# Patient Record
Sex: Male | Born: 1990 | Race: Black or African American | Hispanic: No | Marital: Single | Smoking: Never smoker
Health system: Southern US, Community
[De-identification: ages and names within clinical notes are randomized; demographics above are authoritative.]

## PROBLEM LIST (undated history)

## (undated) DIAGNOSIS — J301 Allergic rhinitis due to pollen: Secondary | ICD-10-CM

## (undated) DIAGNOSIS — J45998 Other asthma: Secondary | ICD-10-CM

## (undated) HISTORY — DX: Allergic rhinitis due to pollen: J30.1

## (undated) HISTORY — PX: TOOTH EXTRACTION: SUR596

## (undated) HISTORY — DX: Other asthma: J45.998

---
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
## 2011-01-11 ENCOUNTER — Emergency Department (HOSPITAL_COMMUNITY): Payer: 59

## 2011-01-11 ENCOUNTER — Emergency Department (HOSPITAL_COMMUNITY)
Admission: EM | Admit: 2011-01-11 | Discharge: 2011-01-11 | Disposition: A | Discharge status: Home / Self Care | Payer: 59 | Attending: Emergency Medicine | Admitting: Emergency Medicine

## 2011-01-11 DIAGNOSIS — R059 Cough, unspecified: Secondary | ICD-10-CM | POA: Insufficient documentation

## 2011-01-11 DIAGNOSIS — J3489 Other specified disorders of nose and nasal sinuses: Secondary | ICD-10-CM | POA: Insufficient documentation

## 2011-01-11 DIAGNOSIS — R509 Fever, unspecified: Secondary | ICD-10-CM | POA: Insufficient documentation

## 2011-01-11 DIAGNOSIS — J45909 Unspecified asthma, uncomplicated: Secondary | ICD-10-CM | POA: Insufficient documentation

## 2011-01-11 DIAGNOSIS — R05 Cough: Secondary | ICD-10-CM | POA: Insufficient documentation

## 2011-01-11 DIAGNOSIS — J40 Bronchitis, not specified as acute or chronic: Secondary | ICD-10-CM | POA: Insufficient documentation

## 2011-01-11 DIAGNOSIS — R0989 Other specified symptoms and signs involving the circulatory and respiratory systems: Secondary | ICD-10-CM | POA: Insufficient documentation

## 2011-05-14 ENCOUNTER — Emergency Department (HOSPITAL_COMMUNITY)
Admission: EM | Admit: 2011-05-14 | Discharge: 2011-05-15 | Disposition: A | Discharge status: Home / Self Care | Payer: 59 | Attending: Emergency Medicine | Admitting: Emergency Medicine

## 2011-05-14 DIAGNOSIS — R071 Chest pain on breathing: Secondary | ICD-10-CM | POA: Insufficient documentation

## 2011-05-14 DIAGNOSIS — J45909 Unspecified asthma, uncomplicated: Secondary | ICD-10-CM | POA: Insufficient documentation

## 2011-05-15 ENCOUNTER — Emergency Department (HOSPITAL_COMMUNITY): Payer: 59

## 2013-10-09 ENCOUNTER — Encounter: Payer: Self-pay | Admitting: Family Medicine

## 2013-10-09 ENCOUNTER — Ambulatory Visit (INDEPENDENT_AMBULATORY_CARE_PROVIDER_SITE_OTHER): Payer: BC Managed Care – PPO | Admitting: Family Medicine

## 2013-10-09 VITALS — BP 118/72 | HR 72 | Temp 97.9°F | Ht 70.5 in | Wt 182.8 lb

## 2013-10-09 DIAGNOSIS — J45998 Other asthma: Secondary | ICD-10-CM

## 2013-10-09 DIAGNOSIS — J45909 Unspecified asthma, uncomplicated: Secondary | ICD-10-CM

## 2013-10-09 DIAGNOSIS — J301 Allergic rhinitis due to pollen: Secondary | ICD-10-CM

## 2013-10-09 NOTE — Progress Notes (Signed)
Date:  10/09/2013   Name:  Corey Hall   DOB:  [birth year]   MRN:  [id number] Gender: male Age: 22 y.o.  Primary Physician:  Hannah Beat, MD   Chief Complaint: Establish Care   Subjective:   History of Present Illness:  Corey Hall is a 22 y.o. pleasant patient who presents with the following:  Healthy young man who recently transferred from [place] in [place] to [place] and see. He went to [employer] high school as well as [employer].  From a health standpoint, he is basically pretty healthy. He does have some ALLERGIC rhinitis, and he takes some ALLERGY shots as well as using some ALLERGY medication.  He did have asthma when he was a child, but he has outgrown this and no longer needs any medication.  He is otherwise healthy and exercises regularly.  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  3 b's and a c.   Patient Active Problem List   Diagnosis Date Noted  . Asthma in remission   . Allergic rhinitis due to pollen     Past Medical History  Diagnosis Date  . Allergic rhinitis due to pollen   . Asthma in remission     Past Surgical History  Procedure Laterality Date  . Tooth extraction      History   Social History  . Marital Status: Single    Spouse Name: N/A    Number of Children: N/A  . Years of Education: N/A   Occupational History  . Student     [employer]   Social History Main Topics  . Smoking status: Never Smoker   . Smokeless tobacco: Never Used  . Alcohol Use: Yes     Comment: intermittent  . Drug Use: Yes     Comment: MJ, intermittent  . Sexual Activity: Yes    Partners: Female    Birth Control/ Protection: Condom   Other Topics Concern  . Not on file   Social History Narrative   Soph at [employer]   Exercise and Sports Science Major   Enjoys working out and [employer]    Family History  Problem Relation Age of Onset  . Hypertension Father   .  Hypertension Maternal Grandmother   . Diabetes Maternal Grandmother   . Heart disease Maternal Grandmother   . Stroke Maternal Grandmother   . Hypertension Paternal Grandmother   . Heart disease Paternal Grandmother   . Cancer Paternal Grandfather     Allergies no known allergies  Medication list has been reviewed and updated.  Review of Systems:   GEN: No acute illnesses, no fevers, chills. GI: No n/v/d, eating normally Pulm: No SOB Interactive and getting along well at home.  Otherwise, ROS is as per the HPI.  Objective:   Physical Examination: BP 118/72  Pulse 72  Temp(Src) 97.9 F (36.6 C) (Oral)  Ht 5' 10.5" (1.791 m)  Wt 182 lb 12 oz (82.895 kg)  BMI 25.84 kg/m2  SpO2 98%  Ideal Body Weight: Weight in (lb) to have BMI = 25: 176.4   GEN: WDWN, NAD, Non-toxic, A & O x 3 HEENT: Atraumatic, Normocephalic. Neck supple. No masses, No LAD. Ears and Nose: No external deformity. CV: RRR, No M/G/R. No JVD. No thrill. No extra heart sounds. PULM: CTA B, no wheezes, crackles, rhonchi. No retractions. No resp. distress. No accessory muscle use. EXTR: No  c/c/e NEURO Normal gait.  PSYCH: Normally interactive. Conversant. Not depressed or anxious appearing.  Calm demeanor.   Assessment & Plan:    Allergic rhinitis due to pollen  Asthma in remission  He is doing perfectly well, f/u prn if injured or sick.  New medications, updates to list, dose adjustments: Meds ordered this encounter  Medications  . fluticasone (VERAMYST) 27.5 MCG/SPRAY nasal spray    Sig: Place 2 sprays into the nose daily.  . fexofenadine (ALLEGRA) 180 MG tablet    Sig: Take 180 mg by mouth daily.    Signed,  Elpidio Galea. Kinston Magnan, MD, CAQ Sports Medicine  [facility] [address] [place] [address] Phone: [phone] Fax: [phone]    Medication List       This list is accurate as of: 10/09/13 11:59 PM.  Always use your most recent med list.                 fexofenadine 180 MG tablet  Commonly known as:  ALLEGRA  Take 180 mg by mouth daily.     fluticasone 27.5 MCG/SPRAY nasal spray  Commonly known as:  VERAMYST  Place 2 sprays into the nose daily.

## 2013-10-10 ENCOUNTER — Encounter: Payer: Self-pay | Admitting: Family Medicine

## 2013-10-10 DIAGNOSIS — J301 Allergic rhinitis due to pollen: Secondary | ICD-10-CM | POA: Insufficient documentation

## 2013-10-10 DIAGNOSIS — J45998 Other asthma: Secondary | ICD-10-CM | POA: Insufficient documentation

## 2014-03-25 ENCOUNTER — Ambulatory Visit (INDEPENDENT_AMBULATORY_CARE_PROVIDER_SITE_OTHER): Payer: BC Managed Care – PPO | Admitting: Family Medicine

## 2014-03-25 ENCOUNTER — Encounter: Payer: Self-pay | Admitting: Family Medicine

## 2014-03-25 VITALS — BP 118/80 | HR 65 | Temp 97.4°F | Ht 70.5 in | Wt 192.5 lb

## 2014-03-25 DIAGNOSIS — S058X9A Other injuries of unspecified eye and orbit, initial encounter: Secondary | ICD-10-CM

## 2014-03-25 DIAGNOSIS — S0502XA Injury of conjunctiva and corneal abrasion without foreign body, left eye, initial encounter: Secondary | ICD-10-CM

## 2014-03-25 NOTE — Progress Notes (Signed)
   [address] [place] [address] Phone: [phone] Fax: [phone]  Patient ID: Corey Hall MRN: [id number], DOB: August 01, 1991, 23 y.o. Date of Encounter: 03/25/2014  Primary Physician:  Hannah Beat, MD   Chief Complaint: Conjunctivitis   Subjective:   History of Present Illness:  Corey Hall is a 23 y.o. very pleasant male patient who presents with the following:  Scratch L eye? Vs pink eye.   Yesterday, when the patient and his gloves off from work, he was scratching his eye calm he thought he heard a little bit and then. Today, he woke up and his eye was goupy and red on the left side. No other known trauma or accident  Past Medical History, Surgical History, Social History, Family History, Problem List, Medications, and Allergies have been reviewed and updated if relevant.  Review of Systems: ROS: GEN: Acute illness details above GI: Tolerating PO intake GU: maintaining adequate hydration and urination Pulm: No SOB Interactive and getting along well at home.  Otherwise, ROS is as per the HPI.   Objective:   Physical Examination: BP 118/80  Pulse 65  Temp(Src) 97.4 F (36.3 C) (Oral)  Ht 5' 10.5" (1.791 m)  Wt 192 lb 8 oz (87.317 kg)  BMI 27.22 kg/m2   GEN: WDWN, NAD, Non-toxic, Alert & Oriented x 3 HEENT: Atraumatic, Normocephalic.  PERRLA, EOMI. Red / pink conjunctiva Ears and Nose: No external deformity. EXTR: No clubbing/cyanosis/edema NEURO: Normal gait.  PSYCH: Normally interactive. Conversant. Not depressed or anxious appearing.  Calm demeanor.     Laboratory and Imaging Data:  Assessment & Plan:   Corneal abrasion, left  Using fluoroscein and a Woods lamp, the patient had a small corneal abrasion on the lateral aspect of his left eye.  This was reviewed with him. I placed him on erythromycin ophthalmological ointment, 1 cm ribbon 6 times daily for 1 week. He knows to seek medical attention if his eye gets worse over  the next few days. He is to call me if he is not doing quite a bit better by Monday.  Signed,  Elpidio Galea. Katalyn Matin, MD, CAQ Sports Medicine  Current Medications at Discharge:   Medication List       This list is accurate as of: 03/25/14  4:12 PM.  Always use your most recent med list.               fexofenadine 180 MG tablet  Commonly known as:  ALLEGRA  Take 180 mg by mouth daily.     fluticasone 27.5 MCG/SPRAY nasal spray  Commonly known as:  VERAMYST  Place 2 sprays into the nose daily.

## 2014-03-25 NOTE — Progress Notes (Signed)
Pre visit review using our clinic review tool, if applicable. No additional management support is needed unless otherwise documented below in the visit note. 

## 2014-03-29 ENCOUNTER — Encounter: Payer: Self-pay | Admitting: Family Medicine

## 2014-03-29 ENCOUNTER — Telehealth: Payer: Self-pay | Admitting: Family Medicine

## 2014-03-29 ENCOUNTER — Ambulatory Visit (INDEPENDENT_AMBULATORY_CARE_PROVIDER_SITE_OTHER): Payer: BC Managed Care – PPO | Admitting: Family Medicine

## 2014-03-29 VITALS — BP 116/78 | HR 76 | Temp 98.5°F | Ht 70.5 in | Wt 192.0 lb

## 2014-03-29 DIAGNOSIS — J029 Acute pharyngitis, unspecified: Secondary | ICD-10-CM

## 2014-03-29 LAB — POCT RAPID STREP A (OFFICE): Rapid Strep A Screen: NEGATIVE

## 2014-03-29 NOTE — Telephone Encounter (Signed)
Have the patient come now, ASAP and i will see him  Force onto my schedule please.

## 2014-03-29 NOTE — Telephone Encounter (Signed)
Patient Information:  Caller Name: Ladean RayaConstance  Phone: [phone]  Patient: Corey Hall, Corey Hall  Gender: Male  DOB: 09/14/1991  Age: 23 Years  PCP: Hannah Beatopland, [facility](Family Practice)  Office Follow Up:  Does the office need to follow up with this patient?: Yes  Instructions For The Office: No appts left for today. Ok to be seen tomorrow? or work-in today available? or should he go to UC today? Please call pt back ASAP to advise.  RN Note:  Will make appt  Symptoms  Reason For Call & Symptoms: Sore throat x 3-4 days with fine red rash on face. Denies rash anywhere else on body. Rash is not bothersome. Also has some nasal congestion and mild cough but states sore throat is most bothersome symptom. No fever.  Reviewed Health History In EMR: Yes  Reviewed Medications In EMR: Yes  Reviewed Allergies In EMR: Yes  Reviewed Surgeries / Procedures: Yes  Date of Onset of Symptoms: 03/26/2014  Treatments Tried: throat lozenges; Tylenol  Treatments Tried Worked: Yes  Guideline(s) Used:  Sore Throat  Disposition Per Guideline:   See Today in Office  Reason For Disposition Reached:   Widespread rash (especially chest and abdomen)------rash is actually on face per patient  Advice Given:  For Relief of Sore Throat Pain:  Sip warm chicken broth or apple juice.  Gargle warm salt water 3 times daily (1 teaspoon of salt in 8 oz or 240 ml of warm water).  Pain Medicines:  For pain relief, you can take either acetaminophen, ibuprofen, or naproxen.  They are over-the-counter (OTC) pain drugs. You can buy them at the drugstore.  Ibuprofen (e.g., Motrin, Advil):  Take 400 mg (two 200 mg pills) by mouth every 6 hours.  Soft Diet:   Cold drinks and milk shakes are especially good (Reason: swollen tonsils can make some foods hard to swallow).  Liquids:  Adequate liquid intake is important to prevent dehydration. Drink 6-8 glasses of water per day.  Call Back If:  You become worse.  Patient Will  Follow Care Advice:  YES

## 2014-03-29 NOTE — Progress Notes (Signed)
[address] [place] [id number] Phone: [phone] Fax: [phone]  Patient ID: Corey LemmaMatthew E Hall MRN: [id number], DOB: 12/05/1990, 23 y.o. Date of Encounter: 03/29/2014  Primary Physician:  Hannah BeatSpencer Faydra Korman, MD   Chief Complaint: Sore Throat   Subjective:   History of Present Illness:  This 23 y.o. male patient presents with sore throat for 2 weeks. No subjective fevers, achiness, headache. Some nausea. No significant URI sx. No significant cough. Mild drainage - AR, stopped allergy meds  + 2 sexual encounters in the last month  The PMH, PSH, Social History, Family History, Medications, and allergies [facility], and have been updated if relevant.   ROS: GEN: Acute illness details above GI: Tolerating PO intake GU: maintaining adequate hydration and urination Pulm: No SOB Interactive and getting along well at home.  Otherwise, ROS is as per the HPI.  Objective:   Physical Exam  Filed Vitals:   03/29/14 1514  BP: 116/78  Pulse: 76  Temp: 98.5 F (36.9 C)  TempSrc: Oral  Height: 5' 10.5" (1.791 m)  Weight: 192 lb (87.091 kg)    Gen: WDWN, NAD; A & O x3, cooperative. Pleasant.Globally Non-toxic HEENT: Normocephalic and atraumatic. Throat: swollen tonsills without exudate R TM clear, L TM - good landmarks, No fluid present. rhinnorhea. No frontal or maxillary sinus T. MMM NECK: Anterior cervical  LAD is present - TTP CV: RRR, No M/G/R, cap refill <2 sec PULM: Breathing comfortably in no respiratory distress. no wheezing, crackles, rhonchi EXT: No c/c/e PSYCH: Friendly, good eye contact MSK: Nml gait   Results for orders placed in visit on 03/29/14  POCT RAPID STREP A (OFFICE)      Result Value Ref Range   Rapid Strep A Screen Negative  Negative    Assessment & Plan:   Sore throat - Plan: POCT rapid strep A, Culture, Group A Strep, GC/CT Probe, Amp (Throat)   Unclear cause. More likely viral, but with exposures and risk,  culture for GC and strep.  New Prescriptions   No medications on file    Orders Placed This Encounter  Procedures  . Culture, Group A Strep  . GC/CT Probe, Amp (Throat)  . POCT rapid strep A    Follow-up: No Follow-up on file. Unless noted above, the patient is to follow-up if symptoms worsen. Red flags were reviewed with the patient.   Patient Instructions: SORE THROAT -Most caused by infections, 80-85% are viral injections.  -Strep throat is bacterial and requires antibiotics -Drainage and cough can irritate throat  TREATMENT 1. Warm liquids, salt water gargles to help with the sore throat. 2. Chloraseptic as needed can help a lot. YOU CANNOT OVERDOSE ON CHLORASEPTIC. I PERSONALLY USE IT ABOUT EVERY 20 MINUTES WITH A SORE THROAT. 3. Cough drops, popsicles, or hard candy 4. Liquids - drink plenty, without caffeine 5. Salt water gargle: 1/2 tsp salt in 1/2 glass warm water 6. Avoid spicy food 7. Get plenty of sleep 8. Ice chips for comfort  Signed,  Leontyne Manville T. Krue Peterka, MD, CAQ Sports Medicine   Discontinued Medications   No medications on file   Current Medications at Discharge:   Medication List       This list is accurate as of: 03/29/14 11:59 PM.  Always use your most recent med list.               fexofenadine 180 MG tablet  Commonly known as:  ALLEGRA  Take 180 mg by mouth  daily.     fluticasone 27.5 MCG/SPRAY nasal spray  Commonly known as:  VERAMYST  Place 2 sprays into the nose daily.

## 2014-03-29 NOTE — Progress Notes (Signed)
Pre visit review using our clinic review tool, if applicable. No additional management support is needed unless otherwise documented below in the visit note. 

## 2014-03-29 NOTE — Telephone Encounter (Signed)
I spoke with patient's mother and she said she'll tell him to come now.  Patient is about 30 minutes away.  I put him on the schedule at 2:30.

## 2014-04-01 ENCOUNTER — Telehealth: Payer: Self-pay | Admitting: *Deleted

## 2014-04-01 LAB — GONOCOCCUS CULTURE: Organism ID, Bacteria: NO GROWTH

## 2014-04-01 LAB — CULTURE, GROUP A STREP

## 2014-04-01 NOTE — Telephone Encounter (Signed)
.  left message to have patient return my call.  

## 2014-04-01 NOTE — Telephone Encounter (Signed)
Message copied by Sueanne MargaritaSMITH, DESHANNON L on Thu Apr 01, 2014  2:43 PM ------      Message from: Hannah BeatOPLAND, SPENCER      Created: Thu Apr 01, 2014  8:42 AM       Please call and send in:            Strep culture was + for a less common type of strep, but we should put on antibiotics.            Gonorrhea negative so far. This one usually grows longer times in the lab. Will let you know if positive. I would think throat hurts from strep.            Amoxicillin 875 mg, 1 po bid, #20, 0 refills            Electronically Signed  By: Hannah BeatSpencer Copland, MD On: 04/01/2014 8:42 AM ------

## 2014-04-02 ENCOUNTER — Other Ambulatory Visit: Payer: Self-pay | Admitting: *Deleted

## 2014-04-02 MED ORDER — AMOXICILLIN 875 MG PO TABS: 875.0000 mg | ORAL_TABLET | Freq: Two times a day (BID) | ORAL | Status: DC

## 2014-04-02 NOTE — Telephone Encounter (Signed)
Spoke with patient and advised results   

## 2014-07-15 ENCOUNTER — Telehealth: Payer: Self-pay | Admitting: Family Medicine

## 2014-07-15 NOTE — Telephone Encounter (Signed)
Ok with me 

## 2014-07-15 NOTE — Telephone Encounter (Signed)
Patient's mother called and said patient would like to switch from Dr.Copland to Dr.Aron.  The family is seeing Dr.Aron and he would like to see the same doctor as the rest of his family. Can patient switch to Dr.Aron?

## 2014-07-15 NOTE — Telephone Encounter (Signed)
This is fine, he is very nice.

## 2014-08-12 ENCOUNTER — Ambulatory Visit: Payer: BC Managed Care – PPO

## 2015-03-10 ENCOUNTER — Ambulatory Visit (INDEPENDENT_AMBULATORY_CARE_PROVIDER_SITE_OTHER)
Admission: RE | Admit: 2015-03-10 | Discharge: 2015-03-10 | Disposition: A | Discharge status: Home / Self Care | Payer: BLUE CROSS/BLUE SHIELD | Source: Ambulatory Visit | Attending: Internal Medicine | Admitting: Internal Medicine

## 2015-03-10 ENCOUNTER — Encounter: Payer: Self-pay | Admitting: Internal Medicine

## 2015-03-10 ENCOUNTER — Ambulatory Visit (INDEPENDENT_AMBULATORY_CARE_PROVIDER_SITE_OTHER): Payer: BLUE CROSS/BLUE SHIELD | Admitting: Internal Medicine

## 2015-03-10 VITALS — BP 124/82 | HR 82 | Temp 98.5°F | Wt 204.0 lb

## 2015-03-10 DIAGNOSIS — S134XXA Sprain of ligaments of cervical spine, initial encounter: Secondary | ICD-10-CM

## 2015-03-10 NOTE — Progress Notes (Signed)
Pre visit review using our clinic review tool, if applicable. No additional management support is needed unless otherwise documented below in the visit note. 

## 2015-03-10 NOTE — Patient Instructions (Signed)
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
## 2015-03-16 ENCOUNTER — Other Ambulatory Visit: Payer: Self-pay

## 2015-03-17 ENCOUNTER — Other Ambulatory Visit: Payer: Self-pay | Admitting: Family Medicine

## 2015-03-17 ENCOUNTER — Other Ambulatory Visit (INDEPENDENT_AMBULATORY_CARE_PROVIDER_SITE_OTHER): Payer: BLUE CROSS/BLUE SHIELD

## 2015-03-17 DIAGNOSIS — Z0000 Encounter for general adult medical examination without abnormal findings: Secondary | ICD-10-CM

## 2015-03-17 LAB — COMPREHENSIVE METABOLIC PANEL
ALBUMIN: 4.3 g/dL (ref 3.5–5.2)
ALT: 53 U/L (ref 0–53)
AST: 42 U/L — ABNORMAL HIGH (ref 0–37)
Alkaline Phosphatase: 45 U/L (ref 39–117)
BILIRUBIN TOTAL: 0.6 mg/dL (ref 0.2–1.2)
BUN: 19 mg/dL (ref 6–23)
CALCIUM: 9.7 mg/dL (ref 8.4–10.5)
CHLORIDE: 103 meq/L (ref 96–112)
CO2: 26 mEq/L (ref 19–32)
Creatinine, Ser: 1.26 mg/dL (ref 0.40–1.50)
GFR: 91.56 mL/min (ref >60.00)
Glucose, Bld: 95 mg/dL (ref 70–99)
Potassium: 4.4 mEq/L (ref 3.5–5.1)
SODIUM: 137 meq/L (ref 135–145)
TOTAL PROTEIN: 7.4 g/dL (ref 6.0–8.3)

## 2015-03-17 LAB — CBC WITH DIFFERENTIAL/PLATELET
BASOS PCT: 0.6 % (ref 0.0–3.0)
Basophils Absolute: 0 10*3/uL (ref 0.0–0.1)
Eosinophils Absolute: 0.2 10*3/uL (ref 0.0–0.7)
Eosinophils Relative: 3.6 % (ref 0.0–5.0)
HEMATOCRIT: 45.9 % (ref 39.0–52.0)
HEMOGLOBIN: 15.3 g/dL (ref 13.0–17.0)
Lymphocytes Relative: 39.1 % (ref 12.0–46.0)
Lymphs Abs: 1.8 10*3/uL (ref 0.7–4.0)
MCHC: 33.4 g/dL (ref 30.0–36.0)
MCV: 83.5 fl (ref 78.0–100.0)
MONO ABS: 0.7 10*3/uL (ref 0.1–1.0)
MONOS PCT: 14.2 % — AB (ref 3.0–12.0)
NEUTROS ABS: 2 10*3/uL (ref 1.4–7.7)
Neutrophils Relative %: 42.5 % — ABNORMAL LOW (ref 43.0–77.0)
Platelets: 270 10*3/uL (ref 150.0–400.0)
RBC: 5.49 Mil/uL (ref 4.22–5.81)
RDW: 14.1 % (ref 11.5–15.5)
WBC: 4.7 10*3/uL (ref 4.0–10.5)

## 2015-03-17 LAB — LIPID PANEL
CHOL/HDL RATIO: 5
CHOLESTEROL: 220 mg/dL — AB (ref 0–200)
HDL: 41.7 mg/dL (ref >39.00)
LDL Cholesterol: 161 mg/dL — ABNORMAL HIGH (ref 0–99)
NONHDL: 178.3
TRIGLYCERIDES: 87 mg/dL (ref 0.0–149.0)
VLDL: 17.4 mg/dL (ref 0.0–40.0)

## 2015-03-18 ENCOUNTER — Telehealth: Payer: Self-pay

## 2015-03-18 ENCOUNTER — Other Ambulatory Visit: Payer: Self-pay | Admitting: Internal Medicine

## 2015-03-18 DIAGNOSIS — S134XXS Sprain of ligaments of cervical spine, sequela: Secondary | ICD-10-CM

## 2015-03-18 LAB — HIV ANTIBODY (ROUTINE TESTING W REFLEX): HIV 1&2 Ab, 4th Generation: NONREACTIVE

## 2015-03-18 NOTE — Telephone Encounter (Signed)
Pts mother left v/m; pt seen 03/10/15 and pt continues to complain with "major headaches", neck and backpain; [facility] request referral for PT or to see chiropractor. Constance request cb.

## 2015-03-18 NOTE — Telephone Encounter (Signed)
Referral placed for PT

## 2015-03-23 ENCOUNTER — Encounter: Payer: Self-pay | Admitting: Family Medicine

## 2015-03-29 ENCOUNTER — Other Ambulatory Visit: Payer: BLUE CROSS/BLUE SHIELD

## 2015-03-29 ENCOUNTER — Ambulatory Visit (INDEPENDENT_AMBULATORY_CARE_PROVIDER_SITE_OTHER): Payer: BLUE CROSS/BLUE SHIELD | Admitting: Family Medicine

## 2015-03-29 ENCOUNTER — Encounter: Payer: Self-pay | Admitting: Family Medicine

## 2015-03-29 VITALS — BP 130/72 | HR 79 | Temp 97.7°F | Ht 71.0 in | Wt 208.5 lb

## 2015-03-29 DIAGNOSIS — Z0000 Encounter for general adult medical examination without abnormal findings: Secondary | ICD-10-CM | POA: Diagnosis not present

## 2015-03-29 DIAGNOSIS — K219 Gastro-esophageal reflux disease without esophagitis: Secondary | ICD-10-CM | POA: Insufficient documentation

## 2015-03-29 DIAGNOSIS — Z113 Encounter for screening for infections with a predominantly sexual mode of transmission: Secondary | ICD-10-CM | POA: Insufficient documentation

## 2015-03-29 DIAGNOSIS — E785 Hyperlipidemia, unspecified: Secondary | ICD-10-CM | POA: Diagnosis not present

## 2015-03-29 NOTE — Progress Notes (Signed)
Pre visit review using our clinic review tool, if applicable. No additional management support is needed unless otherwise documented below in the visit note. 

## 2015-03-29 NOTE — Progress Notes (Signed)
Subjective:   Patient ID: Corey Hall, male    DOB: 07-Dec-1990, 24 y.o.   MRN: [id number]  Corey Hall is a pleasant 24 y.o. year old male, new to me, who presents to clinic today with Annual Exam and Gastrophageal Reflux  on 03/29/2015  HPI:  HLD-  Personal trainer.  Eats 4 eggs per day and two tablespoons of almond butter. No family h/o HLD.  Has never been told he has HLD in past.  Does not drink ETOH.  GERD- worse lately. Feels burning in his throat.  Not taking anything for it.  Does admit to liking spicy food.  No black or bloody stools.  No epigastric or abdominal pain.  Has vomited once when it "got bad."  No bloody emesis.  Sexually active with one woman.  Wears condoms.  Never treated for an STD.  Lab Results  Component Value Date   CHOL 220* 03/17/2015   HDL 41.70 03/17/2015   LDLCALC 161* 03/17/2015   TRIG 87.0 03/17/2015   CHOLHDL 5 03/17/2015   Lab Results  Component Value Date   WBC 4.7 03/17/2015   HGB 15.3 03/17/2015   HCT 45.9 03/17/2015   MCV 83.5 03/17/2015   PLT 270.0 03/17/2015   Lab Results  Component Value Date   NA 137 03/17/2015   K 4.4 03/17/2015   CL 103 03/17/2015   CO2 26 03/17/2015   Lab Results  Component Value Date   CREATININE 1.26 03/17/2015   Current Outpatient Prescriptions on File Prior to Visit  Medication Sig Dispense Refill  . fexofenadine (ALLEGRA) 180 MG tablet Take 180 mg by mouth daily.    . fluticasone (VERAMYST) 27.5 MCG/SPRAY nasal spray Place 2 sprays into the nose daily.     No current facility-administered medications on file prior to visit.    No Known Allergies  Past Medical History  Diagnosis Date  . Allergic rhinitis due to pollen   . Asthma in remission     Past Surgical History  Procedure Laterality Date  . Tooth extraction      Family History  Problem Relation Age of Onset  . Hypertension Father   . Hypertension Maternal Grandmother   . Diabetes Maternal Grandmother   .  Heart disease Maternal Grandmother   . Stroke Maternal Grandmother   . Hypertension Paternal Grandmother   . Heart disease Paternal Grandmother   . Cancer Paternal Grandfather     History   Social History  . Marital Status: Single    Spouse Name: N/A  . Number of Children: N/A  . Years of Education: N/A   Occupational History  . Student     [employer]   Social History Main Topics  . Smoking status: Never Smoker   . Smokeless tobacco: Never Used  . Alcohol Use: Yes     Comment: intermittent  . Drug Use: Yes     Comment: MJ, intermittent  . Sexual Activity:    Partners: Female    Pharmacist, hospital Protection: Condom   Other Topics Concern  . Not on file   Social History Narrative   Soph at [employer]   Exercise and Sports Science Major   Enjoys working out and [employer]   The PMH, PSH, Social History, Family History, Medications, and allergies have been reviewed in [facility], and have been updated if relevant.   Review of Systems  Constitutional: Negative.   HENT: Negative.   Eyes: Negative.  Respiratory: Negative.   Cardiovascular: Negative.   Gastrointestinal: Positive for vomiting. Negative for nausea and abdominal distention.  Endocrine: Negative.   Genitourinary: Negative.   Musculoskeletal: Negative.   Skin: Negative.   Allergic/Immunologic: Negative.   Neurological: Negative.   Hematological: Negative.   Psychiatric/Behavioral: Negative.   All other systems reviewed and are negative.      Objective:    BP 130/72 mmHg  Pulse 79  Temp(Src) 97.7 F (36.5 C) (Oral)  Ht  (1.803 m)  Wt 208 lb 8 oz (94.575 kg)  BMI 29.09 kg/m2  SpO2 98%   Physical Exam  Constitutional: He is oriented to person, place, and time. He appears well-developed and well-nourished. No distress.  HENT:  Head: Normocephalic.  Eyes: Conjunctivae are normal.  Neck: Normal range of motion.  Cardiovascular: Normal rate, regular rhythm and normal heart sounds.     Pulmonary/Chest: Effort normal and breath sounds normal. No respiratory distress. He has no wheezes.  Abdominal: Soft. Bowel sounds are normal. He exhibits no distension and no mass. There is no tenderness. There is no rebound and no guarding.  Musculoskeletal: He exhibits no edema or tenderness.  Neurological: He is alert and oriented to person, place, and time. No cranial nerve deficit.  Skin: Skin is warm and dry.  Psychiatric: He has a normal mood and affect. His behavior is normal. Judgment and thought content normal.  Nursing note and vitals reviewed.         Assessment & Plan:   Visit for well man health check  HLD (hyperlipidemia)  Gastroesophageal reflux disease, esophagitis presence not specified - Plan: H. pylori antibody, IgG  Screening for STD (sexually transmitted disease) - Plan: HIV antibody (with reflex), RPR No Follow-up on file.

## 2015-03-29 NOTE — Assessment & Plan Note (Signed)
New- likely due to diet. Cut back on eggs and nut butters. Follow up labs in 6 months. The patient indicates understanding of these issues and agrees with the plan.

## 2015-03-29 NOTE — Assessment & Plan Note (Signed)
Reviewed preventive care protocols, scheduled due services, and updated immunizations Discussed nutrition, exercise, diet, and healthy lifestyle.  

## 2015-03-29 NOTE — Patient Instructions (Signed)
Great to meet you. Say hi to your mom for me.  We will call you with your lab results.  Try Pepcid 20 mg daily.  Cut back on eggs and almond butter- you do not have to STOP eating them.  Try egg whites.  Please come back in 6 months for labs.

## 2015-03-29 NOTE — Assessment & Plan Note (Addendum)
Deteriorated. Discussed GERD friendly diet. Trial of pepcid 20 mg daily. Call or return to clinic prn if these symptoms worsen or fail to improve as anticipated. H pylori today. The patient indicates understanding of these issues and agrees with the plan.

## 2015-03-29 NOTE — Assessment & Plan Note (Signed)
Orders Placed This Encounter  Procedures  . HIV antibody (with reflex)  . RPR  . H. pylori antibody, IgG    Discussed dangers of smoking, alcohol, and drug abuse.  Also discussed sexual activity, pregnancy risk, and STD risk.

## 2015-03-30 LAB — RPR

## 2015-03-30 LAB — HIV ANTIBODY (ROUTINE TESTING W REFLEX): HIV: NONREACTIVE

## 2015-03-30 LAB — H. PYLORI ANTIBODY, IGG: H Pylori IgG: NEGATIVE

## 2015-10-03 ENCOUNTER — Ambulatory Visit: Payer: BLUE CROSS/BLUE SHIELD | Admitting: Family Medicine

## 2015-10-03 ENCOUNTER — Telehealth: Payer: Self-pay | Admitting: Family Medicine

## 2015-10-03 NOTE — Telephone Encounter (Signed)
Pt did not come in for their appt today for cpe. Please let me know if pt needs to be contacted immediately for follow up or no follow up needed. Best phone number to contact pt is [phone].

## 2016-02-13 ENCOUNTER — Telehealth: Payer: Self-pay

## 2016-02-13 NOTE — Telephone Encounter (Signed)
Corey Hall (DPR signed) request refill for inhaler; do not see inhaler on med list; Corey RayaConstance will ck on inhaler at pharmacy and cb if needed.

## 2016-07-10 ENCOUNTER — Emergency Department (HOSPITAL_COMMUNITY): Payer: BLUE CROSS/BLUE SHIELD

## 2016-07-10 ENCOUNTER — Encounter (HOSPITAL_COMMUNITY): Payer: Self-pay | Admitting: Emergency Medicine

## 2016-07-10 ENCOUNTER — Emergency Department (HOSPITAL_COMMUNITY)
Admission: EM | Admit: 2016-07-10 | Discharge: 2016-07-10 | Disposition: A | Discharge status: Home / Self Care | Payer: BLUE CROSS/BLUE SHIELD | Attending: Emergency Medicine | Admitting: Emergency Medicine

## 2016-07-10 DIAGNOSIS — J45909 Unspecified asthma, uncomplicated: Secondary | ICD-10-CM | POA: Insufficient documentation

## 2016-07-10 DIAGNOSIS — J189 Pneumonia, unspecified organism: Secondary | ICD-10-CM | POA: Diagnosis not present

## 2016-07-10 DIAGNOSIS — R05 Cough: Secondary | ICD-10-CM | POA: Diagnosis present

## 2016-07-10 MED ORDER — ALBUTEROL SULFATE HFA 108 (90 BASE) MCG/ACT IN AERS
1.0000 | INHALATION_SPRAY | Freq: Four times a day (QID) | RESPIRATORY_TRACT | 0 refills | Status: DC | PRN
Start: 2016-07-10 — End: 2016-12-28

## 2016-07-10 MED ORDER — IPRATROPIUM-ALBUTEROL 0.5-2.5 (3) MG/3ML IN SOLN
3.0000 mL | Freq: Once | RESPIRATORY_TRACT | Status: AC
  Administered 2016-07-10: 3 mL via RESPIRATORY_TRACT
  Filled 2016-07-10: qty 3

## 2016-07-10 MED ORDER — DOXYCYCLINE HYCLATE 100 MG PO CAPS: 100.0000 mg | ORAL_CAPSULE | Freq: Two times a day (BID) | ORAL | 0 refills | Status: DC

## 2016-07-10 MED ORDER — ALBUTEROL SULFATE (2.5 MG/3ML) 0.083% IN NEBU: 2.5000 mg | INHALATION_SOLUTION | Freq: Four times a day (QID) | RESPIRATORY_TRACT | 0 refills | Status: DC | PRN

## 2016-07-10 NOTE — ED Provider Notes (Signed)
[facility]EMER[facility]ENCY DEPT Provider Note   CSN: [id number] Arrival date & time: 07/10/16  0610     History   Chief Complaint Chief Complaint  Patient presents with  . Cough  . Nasal Congestion    HPI Corey Hall is a 25 y.o. male.  HPI Corey Hall is a 25 y.o. male wi th PMH significant for asthma and allergic rhinitis who presents with gradual onset, constant, worsening productive cough and nasal congestion since last week. Associated symptoms include shortness of breath and chest tightness, he describes feels similar to his asthma exacerbations. He also states he's had some myalgias. No fever, sore throat, otalgia, neck stiffness, nausea, vomiting, diarrhea, or abdominal pain. He has been taking TheraFlu with some relief. He has also been using his albuterol inhaler. He does not smoke.   Past Medical History:  Diagnosis Date  . Allergic rhinitis due to pollen   . Asthma in remission     Patient Active Problem List   Diagnosis Date Noted  . Visit for well man health check 03/29/2015  . HLD (hyperlipidemia) 03/29/2015  . GERD (gastroesophageal reflux disease) 03/29/2015  . Screening for STD (sexually transmitted disease) 03/29/2015  . Asthma in remission   . Allergic rhinitis due to pollen     Past Surgical History:  Procedure Laterality Date  . TOOTH EXTRACTION         Home Medications    Prior to Admission medications   Medication Sig Start Date End Date Taking? Authorizing Provider  albuterol (PROVENTIL HFA;VENTOLIN HFA) 108 (90 Base) MCG/ACT inhaler Inhale 1-2 puffs into the lungs every 6 (six) hours as needed for wheezing or shortness of breath. 07/10/16   Cheri Fowler, PA-C  albuterol (PROVENTIL) (2.5 MG/3ML) 0.083% nebulizer solution Take 3 mLs (2.5 mg total) by nebulization every 6 (six) hours as needed for wheezing or shortness of breath. 07/10/16   Cheri Fowler, PA-C  doxycycline (VIBRAMYCIN) 100 MG capsule Take 1 capsule (100 mg total) by mouth 2 (two)  times daily. 07/10/16   Cheri Fowler, PA-C  fexofenadine (ALLEGRA) 180 MG tablet Take 180 mg by mouth daily.    Historical Provider, MD  fluticasone (VERAMYST) 27.5 MCG/SPRAY nasal spray Place 2 sprays into the nose daily.    Historical Provider, MD    Family History Family History  Problem Relation Age of Onset  . Hypertension Father   . Hypertension Maternal Grandmother   . Diabetes Maternal Grandmother   . Heart disease Maternal Grandmother   . Stroke Maternal Grandmother   . Hypertension Paternal Grandmother   . Heart disease Paternal Grandmother   . Cancer Paternal Grandfather     Social History Social History  Substance Use Topics  . Smoking status: Never Smoker  . Smokeless tobacco: Never Used  . Alcohol use Yes     Comment: intermittent     Allergies   Review of patient's allergies indicates no known allergies.   Review of Systems Review of Systems All other systems negative unless otherwise stated in HPI   Physical Exam Updated Vital Signs BP 128/64 (BP Location: Right Arm)   Pulse 90   Temp 98.3 F (36.8 C) (Oral)   Resp 15   SpO2 95%   Physical Exam  Constitutional: He is oriented to person, place, and time. He appears well-developed and well-nourished. He is active.  Non-toxic appearance. He does not have a sickly appearance. He does not appear ill.  HENT:  Head: Normocephalic and atraumatic.  Right Ear: Tympanic  membrane and external ear normal. Tympanic membrane is not erythematous and not bulging.  Left Ear: Tympanic membrane and external ear normal. Tympanic membrane is not erythematous and not bulging.  Nose: Nose normal.  Mouth/Throat: Uvula is midline, oropharynx is clear and moist and mucous membranes are normal. No trismus in the jaw. No uvula swelling. No oropharyngeal exudate, posterior oropharyngeal edema, posterior oropharyngeal erythema or tonsillar abscesses.  Neck: Normal range of motion. Neck supple.  No nuchal rigidity.     Cardiovascular: Normal rate and regular rhythm.   Pulmonary/Chest: Effort normal. No accessory muscle usage. No tachypnea. No respiratory distress. He has wheezes (expiratory). He has rhonchi. He has no rales.  95% on RA. No audible wheezes.   Abdominal: Soft. Bowel sounds are normal. He exhibits no distension. There is no tenderness.  Musculoskeletal: Normal range of motion.  Lymphadenopathy:    He has no cervical adenopathy.  Neurological: He is alert and oriented to person, place, and time.  Skin: Skin is warm and dry.  Psychiatric: He has a normal mood and affect. His behavior is normal.     ED Treatments / Results  Labs (all labs ordered are listed, but only abnormal results are displayed) Labs Reviewed - No data to display  EKG  EKG Interpretation None       Radiology Dg Chest 2 View  Result Date: 07/10/2016 CLINICAL DATA:  Cough, chest pain and shortness of breath for 2 days. EXAM: CHEST  2 VIEW COMPARISON:  Chest radiograph May 15, 2011 FINDINGS: Cardiomediastinal silhouette is normal. Hazy opacities in the LEFT lung base. Mild bronchial wall thickening. No pleural effusion. No pneumothorax. IMPRESSION: Bronchial wall thickening can be seen with bronchitis other reactive airway disease. LEFT lung base atelectasis or possibly pneumonia. Electronically Signed   By: Awilda Metroourtnay  Bloomer M.D.   On: 07/10/2016 06:41    Procedures Procedures (including critical care time)  Medications Ordered in ED Medications  ipratropium-albuterol (DUONEB) 0.5-2.5 (3) MG/3ML nebulizer solution 3 mL (3 mLs Nebulization Given 07/10/16 0746)     Initial Impression / Assessment and Plan / ED Course  I have reviewed the triage vital signs and the nursing notes.  Pertinent labs & imaging results that were available during my care of the patient were reviewed by me and considered in my medical decision making (see chart for details).  Clinical Course   Patient with hx of asthma presents  with cough and congestion.  VSS, NAD.  No signs of respiratory distress.  Lung sounds with expiratory wheezes and rhonchi throughout.  CXR shows bronchial wall thickening and left lung base opacity.  Given concern for PNA, will treat with Doxycycline.  Patient received duoneb in ED with improvement.  Repeat lung exam improved, no wheezes.  Will refill albuterol, neb, and home with abx.  Follow up PCP.  Return precautions discussed.  Stable for discharge.    Final Clinical Impressions(s) / ED Diagnoses   Final diagnoses:  CAP (community acquired pneumonia)    New Prescriptions Discharge Medication List as of 07/10/2016  8:24 AM    START taking these medications   Details  albuterol (PROVENTIL HFA;VENTOLIN HFA) 108 (90 Base) MCG/ACT inhaler Inhale 1-2 puffs into the lungs every 6 (six) hours as needed for wheezing or shortness of breath., Starting Tue 07/10/2016, Print    albuterol (PROVENTIL) (2.5 MG/3ML) 0.083% nebulizer solution Take 3 mLs (2.5 mg total) by nebulization every 6 (six) hours as needed for wheezing or shortness of breath., Starting Tue 07/10/2016, Print  doxycycline (VIBRAMYCIN) 100 MG capsule Take 1 capsule (100 mg total) by mouth 2 (two) times daily., Starting Tue 07/10/2016, Print         Cheri Fowler, PA-C 07/10/16 1547    Marily Memos, MD 07/11/16 [phone]

## 2016-07-10 NOTE — ED Triage Notes (Signed)
Pt. reports persistent productive cough with chest congestion / runny nose and nasal congestion onset last week unrelieved by OTC medications , denies fever or chills.

## 2016-07-10 NOTE — Discharge Instructions (Signed)
Take Doxycycline twice daily for the next 7 days.  You may also take mucinex, tylenol, and/or motrin for symptom control.  Follow up with your primary care physician in 1 week.  Return if you experience worsening cough, fever, shortness of breath, or chest pain.

## 2016-07-18 ENCOUNTER — Encounter: Payer: BLUE CROSS/BLUE SHIELD | Admitting: Family Medicine

## 2016-07-19 ENCOUNTER — Encounter: Payer: Self-pay | Admitting: Family Medicine

## 2016-07-19 ENCOUNTER — Ambulatory Visit (INDEPENDENT_AMBULATORY_CARE_PROVIDER_SITE_OTHER): Payer: BLUE CROSS/BLUE SHIELD | Admitting: Family Medicine

## 2016-07-19 VITALS — BP 120/80 | HR 86 | Temp 98.1°F | Wt 220.8 lb

## 2016-07-19 DIAGNOSIS — J4530 Mild persistent asthma, uncomplicated: Secondary | ICD-10-CM | POA: Diagnosis not present

## 2016-07-19 DIAGNOSIS — R059 Cough, unspecified: Secondary | ICD-10-CM

## 2016-07-19 DIAGNOSIS — R05 Cough: Secondary | ICD-10-CM

## 2016-07-19 MED ORDER — PREDNISONE 20 MG PO TABS: ORAL_TABLET | ORAL | 0 refills | Status: DC

## 2016-07-19 MED ORDER — BENZONATATE 100 MG PO CAPS: 100.0000 mg | ORAL_CAPSULE | Freq: Three times a day (TID) | ORAL | 0 refills | Status: DC | PRN

## 2016-07-19 NOTE — Patient Instructions (Addendum)
Please use either your nebulizer or your inhaler every 4 to 6 hours while you are awake for the next 3 days  Prednisone tablets What is this medicine? PREDNISONE (PRED ni sone) is a corticosteroid. It is commonly used to treat inflammation of the skin, joints, lungs, and other organs. Common conditions treated include asthma, allergies, and arthritis. It is also used for other conditions, such as blood disorders and diseases of the adrenal glands. This medicine may be used for other purposes; ask your health care provider or pharmacist if you have questions. What should I tell my health care provider before I take this medicine? They need to know if you have any of these conditions: -Cushing's syndrome -diabetes -glaucoma -heart disease -high blood pressure -infection (especially a virus infection such as chickenpox, cold sores, or herpes) -kidney disease -liver disease -mental illness -myasthenia gravis -osteoporosis -seizures -stomach or intestine problems -thyroid disease -an unusual or allergic reaction to lactose, prednisone, other medicines, foods, dyes, or preservatives -pregnant or trying to get pregnant -breast-feeding How should I use this medicine? Take this medicine by mouth with a glass of water. Follow the directions on the prescription label. Take this medicine with food. If you are taking this medicine once a day, take it in the morning. Do not take more medicine than you are told to take. Do not suddenly stop taking your medicine because you may develop a severe reaction. Your doctor will tell you how much medicine to take. If your doctor wants you to stop the medicine, the dose may be slowly lowered over time to avoid any side effects. Talk to your pediatrician regarding the use of this medicine in children. Special care may be needed. Overdosage: If you think you have taken too much of this medicine contact a poison control center or emergency room at once. NOTE: This  medicine is only for you. Do not share this medicine with others. What if I miss a dose? If you miss a dose, take it as soon as you can. If it is almost time for your next dose, talk to your doctor or health care professional. You may need to miss a dose or take an extra dose. Do not take double or extra doses without advice. What may interact with this medicine? Do not take this medicine with any of the following medications: -metyrapone -mifepristone This medicine may also interact with the following medications: -aminoglutethimide -amphotericin B -aspirin and aspirin-like medicines -barbiturates -certain medicines for diabetes, like glipizide or glyburide -cholestyramine -cholinesterase inhibitors -cyclosporine -digoxin -diuretics -ephedrine -male hormones, like estrogens and birth control pills -isoniazid -ketoconazole -NSAIDS, medicines for pain and inflammation, like ibuprofen or naproxen -phenytoin -rifampin -toxoids -vaccines -warfarin This list may not describe all possible interactions. Give your health care provider a list of all the medicines, herbs, non-prescription drugs, or dietary supplements you use. Also tell them if you smoke, drink alcohol, or use illegal drugs. Some items may interact with your medicine. What should I watch for while using this medicine? Visit your doctor or health care professional for regular checks on your progress. If you are taking this medicine over a prolonged period, carry an identification card with your name and address, the type and dose of your medicine, and your doctor's name and address. This medicine may increase your risk of getting an infection. Tell your doctor or health care professional if you are around anyone with measles or chickenpox, or if you develop sores or blisters that do not heal properly.  If you are going to have surgery, tell your doctor or health care professional that you have taken this medicine within the  last twelve months. Ask your doctor or health care professional about your diet. You may need to lower the amount of salt you eat. This medicine may affect blood sugar levels. If you have diabetes, check with your doctor or health care professional before you change your diet or the dose of your diabetic medicine. What side effects may I notice from receiving this medicine? Side effects that you should report to your doctor or health care professional as soon as possible: -allergic reactions like skin rash, itching or hives, swelling of the face, lips, or tongue -changes in emotions or moods -changes in vision -depressed mood -eye pain -fever or chills, cough, sore throat, pain or difficulty passing urine -increased thirst -swelling of ankles, feet Side effects that usually do not require medical attention (report to your doctor or health care professional if they continue or are bothersome): -confusion, excitement, restlessness -headache -nausea, vomiting -skin problems, acne, thin and shiny skin -trouble sleeping -weight gain This list may not describe all possible side effects. Call your doctor for medical advice about side effects. You may report side effects to FDA at 1-800-FDA-1088. Where should I keep my medicine? Keep out of the reach of children. Store at room temperature between 15 and 30 degrees C (59 and 86 degrees F). Protect from light. Keep container tightly closed. Throw away any unused medicine after the expiration date. NOTE: This sheet is a summary. It may not cover all possible information. If you have questions about this medicine, talk to your doctor, pharmacist, or health care provider.    2016, Elsevier/Gold Standard. (2011-05-17 10:57:14)

## 2016-07-19 NOTE — Progress Notes (Signed)
Subjective:    Patient ID: Corey LemmaMatthew E Hall, male    DOB: 02/06/1991, 25 y.o.   MRN: [id number]  HPI This is a 25 yo male who presents today with cough and SOB. He was seen in ED 07/10/16 and CXR showed left lung base opacities. He was treated with doxycycline and albuterol inhaler and home nebulizer. Feels somewhat better since finishing doxycycline but continues to feel SOB and have some intermittent wheeze. Energy level is good. Has not been exercising due to fatigue and SOB. No fever/chills. Has history of asthma in childhood that did not bother him for several years.    Past Medical History:  Diagnosis Date  . Allergic rhinitis due to pollen   . Asthma in remission    Past Surgical History:  Procedure Laterality Date  . TOOTH EXTRACTION     Family History  Problem Relation Age of Onset  . Hypertension Father   . Hypertension Maternal Grandmother   . Diabetes Maternal Grandmother   . Heart disease Maternal Grandmother   . Stroke Maternal Grandmother   . Hypertension Paternal Grandmother   . Heart disease Paternal Grandmother   . Cancer Paternal Grandfather    Social History  Substance Use Topics  . Smoking status: Never Smoker  . Smokeless tobacco: Never Used  . Alcohol use Yes     Comment: intermittent      Review of Systems Per HPI    Objective:   Physical Exam Physical Exam  Constitutional: Oriented to person, place, and time. He appears well-developed and well-nourished.  HENT:  Head: Normocephalic and atraumatic.  Eyes: Conjunctivae are normal.  Neck: Normal range of motion. Neck supple.  Cardiovascular: Normal rate, regular rhythm and normal heart sounds.   Pulmonary/Chest: Effort normal and breath sounds normal.  Musculoskeletal: Normal range of motion.  Neurological: Alert and oriented to person, place, and time.  Skin: Skin is warm and dry.  Psychiatric: Normal mood and affect. Behavior is normal. Judgment and thought content normal.  Vitals  reviewed.     BP 120/80   Pulse 86   Temp 98.1 F (36.7 C)   Wt 220 lb 12.8 oz (100.2 kg)   SpO2 97%   BMI 30.80 kg/m  Wt Readings from Last 3 Encounters:  07/19/16 220 lb 12.8 oz (100.2 kg)  03/29/15 208 lb 8 oz (94.6 kg)  03/10/15 204 lb (92.5 kg)   Peak Flow- predicted 617, best of 3 was 475= 76% of predicted    Assessment & Plan:  1. Mild persistent asthma without complication - lung sounds improved from ER notes, it is a little soon to recheck xray in the absence of abnormal lung sounds. - will treat with prednisone  - Provided written and verbal information regarding diagnosis and treatment. - discussed potential side effects of prednisone - he was instructed to use either inhaler or neb treatment 4-6 x a day for next several days - unable to do full PFTs in office today due to machine malfunction - may need inhaled corticosteroid - predniSONE (DELTASONE) 20 MG tablet; Take 3 tabs x 3 days, then 2 x 3 days, then 1 x 3 days  Dispense: 18 tablet; Refill: 0 - has upcoming appointment with Dr. Dayton MartesAron  2. Cough - benzonatate (TESSALON) 100 MG capsule; Take 1-2 capsules (100-200 mg total) by mouth 3 (three) times daily as needed for cough.  Dispense: 20 capsule; Refill: 0   Olean Reeeborah Gessner, FNP-BC  [facility]  07/20/2016 8:12 PM

## 2016-07-24 ENCOUNTER — Encounter: Payer: Self-pay | Admitting: Family Medicine

## 2016-07-24 ENCOUNTER — Ambulatory Visit (INDEPENDENT_AMBULATORY_CARE_PROVIDER_SITE_OTHER): Payer: BLUE CROSS/BLUE SHIELD | Admitting: Family Medicine

## 2016-07-24 VITALS — BP 132/70 | HR 88 | Temp 98.3°F | Ht 71.0 in | Wt 216.5 lb

## 2016-07-24 DIAGNOSIS — J45998 Other asthma: Secondary | ICD-10-CM

## 2016-07-24 DIAGNOSIS — Z23 Encounter for immunization: Secondary | ICD-10-CM

## 2016-07-24 DIAGNOSIS — K219 Gastro-esophageal reflux disease without esophagitis: Secondary | ICD-10-CM | POA: Diagnosis not present

## 2016-07-24 DIAGNOSIS — E785 Hyperlipidemia, unspecified: Secondary | ICD-10-CM

## 2016-07-24 DIAGNOSIS — Z0000 Encounter for general adult medical examination without abnormal findings: Secondary | ICD-10-CM

## 2016-07-24 DIAGNOSIS — J189 Pneumonia, unspecified organism: Secondary | ICD-10-CM | POA: Insufficient documentation

## 2016-07-24 LAB — LIPID PANEL
Cholesterol: 200 mg/dL (ref 0–200)
HDL: 44 mg/dL (ref >39.00)
LDL Cholesterol: 131 mg/dL — ABNORMAL HIGH (ref 0–99)
NonHDL: 156.38
Total CHOL/HDL Ratio: 5
Triglycerides: 129 mg/dL (ref 0.0–149.0)
VLDL: 25.8 mg/dL (ref 0.0–40.0)

## 2016-07-24 LAB — CBC WITH DIFFERENTIAL/PLATELET
Basophils Absolute: 0 10*3/uL (ref 0.0–0.1)
Basophils Relative: 0.2 % (ref 0.0–3.0)
Eosinophils Absolute: 0 10*3/uL (ref 0.0–0.7)
Eosinophils Relative: 0.3 % (ref 0.0–5.0)
HCT: 46.2 % (ref 39.0–52.0)
Hemoglobin: 15.1 g/dL (ref 13.0–17.0)
Lymphocytes Relative: 31.8 % (ref 12.0–46.0)
Lymphs Abs: 3.8 10*3/uL (ref 0.7–4.0)
MCHC: 32.8 g/dL (ref 30.0–36.0)
MCV: 83.8 fl (ref 78.0–100.0)
Monocytes Absolute: 0.6 10*3/uL (ref 0.1–1.0)
Monocytes Relative: 5.1 % (ref 3.0–12.0)
Neutro Abs: 7.5 10*3/uL (ref 1.4–7.7)
Neutrophils Relative %: 62.6 % (ref 43.0–77.0)
Platelets: 290 10*3/uL (ref 150.0–400.0)
RBC: 5.51 Mil/uL (ref 4.22–5.81)
RDW: 13.9 % (ref 11.5–15.5)
WBC: 12.1 10*3/uL — ABNORMAL HIGH (ref 4.0–10.5)

## 2016-07-24 LAB — COMPREHENSIVE METABOLIC PANEL
ALBUMIN: 3.8 g/dL (ref 3.5–5.2)
ALK PHOS: 38 U/L — AB (ref 39–117)
ALT: 42 U/L (ref 0–53)
AST: 23 U/L (ref 0–37)
BILIRUBIN TOTAL: 0.3 mg/dL (ref 0.2–1.2)
BUN: 17 mg/dL (ref 6–23)
CALCIUM: 9.8 mg/dL (ref 8.4–10.5)
CHLORIDE: 103 meq/L (ref 96–112)
CO2: 29 mEq/L (ref 19–32)
CREATININE: 1.44 mg/dL (ref 0.40–1.50)
GFR: 77.56 mL/min (ref >60.00)
Glucose, Bld: 131 mg/dL — ABNORMAL HIGH (ref 70–99)
Potassium: 3.6 mEq/L (ref 3.5–5.1)
SODIUM: 139 meq/L (ref 135–145)
TOTAL PROTEIN: 7.2 g/dL (ref 6.0–8.3)

## 2016-07-24 NOTE — Progress Notes (Signed)
Pre visit review using our clinic review tool, if applicable. No additional management support is needed unless otherwise documented below in the visit note. 

## 2016-07-24 NOTE — Assessment & Plan Note (Signed)
Reviewed preventive care protocols, scheduled due services, and updated immunizations Discussed nutrition, exercise, diet, and healthy lifestyle.  

## 2016-07-24 NOTE — Assessment & Plan Note (Signed)
Lung sounds clear now and asymptomatic.

## 2016-07-24 NOTE — Progress Notes (Signed)
Subjective:   Patient ID: Bryan LemmaMatthew E Menz, male    DOB: 12/15/1990, 25 y.o.   MRN: [id number]  Bryan LemmaMatthew E Schear is a pleasant 25 y.o. year old male, new to me, who presents to clinic today with Annual Exam  on 07/24/2016  HPI:  CAP- was seen in ER on 9/26 and diagnosed with PNA. [place] Sprangebbie Gessner for follow up on 07/19/16.  Both notes reviewed. Finished course of doxycyline, prednisone and tessalon perles added at follow up visit given persistent couhgh and wheezing.  He feels much better today- cough is almost gone.  No wheezing but has been using his albuterol inhaler as needed.  HLD-  Was elevated last year.  Told to cut back on eggs and almond butter.  Due for labs.  Does not drink ETOH.   Sexually active with one woman.  Wears condoms.  Never treated for an STD.  Lab Results  Component Value Date   CHOL 220 (H) 03/17/2015   HDL 41.70 03/17/2015   LDLCALC 161 (H) 03/17/2015   TRIG 87.0 03/17/2015   CHOLHDL 5 03/17/2015   Lab Results  Component Value Date   WBC 4.7 03/17/2015   HGB 15.3 03/17/2015   HCT 45.9 03/17/2015   MCV 83.5 03/17/2015   PLT 270.0 03/17/2015   Lab Results  Component Value Date   NA 137 03/17/2015   K 4.4 03/17/2015   CL 103 03/17/2015   CO2 26 03/17/2015   Lab Results  Component Value Date   CREATININE 1.26 03/17/2015   Current Outpatient Prescriptions on File Prior to Visit  Medication Sig Dispense Refill  . albuterol (PROVENTIL HFA;VENTOLIN HFA) 108 (90 Base) MCG/ACT inhaler Inhale 1-2 puffs into the lungs every 6 (six) hours as needed for wheezing or shortness of breath. 1 Inhaler 0  . albuterol (PROVENTIL) (2.5 MG/3ML) 0.083% nebulizer solution Take 3 mLs (2.5 mg total) by nebulization every 6 (six) hours as needed for wheezing or shortness of breath. 75 mL 0  . fexofenadine (ALLEGRA) 180 MG tablet Take 180 mg by mouth daily.    . fluticasone (VERAMYST) 27.5 MCG/SPRAY nasal spray Place 2 sprays into the nose daily.    .  predniSONE (DELTASONE) 20 MG tablet Take 3 tabs x 3 days, then 2 x 3 days, then 1 x 3 days 18 tablet 0   No current facility-administered medications on file prior to visit.     No Known Allergies  Past Medical History:  Diagnosis Date  . Allergic rhinitis due to pollen   . Asthma in remission     Past Surgical History:  Procedure Laterality Date  . TOOTH EXTRACTION      Family History  Problem Relation Age of Onset  . Hypertension Father   . Hypertension Maternal Grandmother   . Diabetes Maternal Grandmother   . Heart disease Maternal Grandmother   . Stroke Maternal Grandmother   . Hypertension Paternal Grandmother   . Heart disease Paternal Grandmother   . Cancer Paternal Grandfather     Social History   Social History  . Marital status: Single    Spouse name: N/A  . Number of children: N/A  . Years of education: N/A   Occupational History  . Student     [employer]   Social History Main Topics  . Smoking status: Never Smoker  . Smokeless tobacco: Never Used  . Alcohol use Yes     Comment: intermittent  . Drug use:  Comment: MJ, intermittent  . Sexual activity: Yes    Partners: Female   Other Topics Concern  . Not on file   Social History Narrative   Soph at [employer]   Exercise and Sports Science Major   Enjoys working out and [employer]   The PMH, PSH, Social History, Family History, Medications, and allergies have been reviewed in [facility], and have been updated if relevant.   Review of Systems  Constitutional: Negative.   HENT: Negative.   Eyes: Negative.   Respiratory: Positive for cough.   Cardiovascular: Negative.   Gastrointestinal: Negative for abdominal distention, nausea and vomiting.  Endocrine: Negative.   Genitourinary: Negative.   Musculoskeletal: Negative.   Skin: Negative.   Allergic/Immunologic: Negative.   Neurological: Negative.   Hematological: Negative.   Psychiatric/Behavioral: Negative.   All other systems  reviewed and are negative.      Objective:    BP 132/70   Pulse 88   Temp 98.3 F (36.8 C) (Oral)   Ht 5\' 11"  (1.803 m)   Wt 216 lb 8 oz (98.2 kg)   SpO2 97%   BMI 30.20 kg/m    Physical Exam  Constitutional: He is oriented to person, place, and time. He appears well-developed and well-nourished. No distress.  HENT:  Head: Normocephalic.  Eyes: Conjunctivae are normal.  Neck: Normal range of motion.  Cardiovascular: Normal rate, regular rhythm and normal heart sounds.   Pulmonary/Chest: Effort normal and breath sounds normal. No respiratory distress. He has no wheezes.  Abdominal: Soft. Bowel sounds are normal. He exhibits no distension and no mass. There is no tenderness. There is no rebound and no guarding.  Musculoskeletal: He exhibits no edema or tenderness.  Neurological: He is alert and oriented to person, place, and time. No cranial nerve deficit.  Skin: Skin is warm and dry.  Psychiatric: He has a normal mood and affect. His behavior is normal. Judgment and thought content normal.  Nursing note and vitals reviewed.         Assessment & Plan:   Visit for well man health check - Plan: Comprehensive metabolic panel, Lipid panel, CBC with Differential/Platelet  Asthma in remission  Gastroesophageal reflux disease, esophagitis presence not specified  Need for influenza vaccination - Plan: Flu Vaccine QUAD 36+ mos PF IM (Fluarix & Fluzone Quad PF) No Follow-up on file.

## 2016-07-24 NOTE — Assessment & Plan Note (Signed)
Lung sounds clear and symptoms resolving s/p doxycyline, prednisone. Call or return to clinic prn if these symptoms worsen or fail to improve as anticipated.

## 2016-07-24 NOTE — Assessment & Plan Note (Signed)
Repeat lipid panel today. 

## 2016-08-08 ENCOUNTER — Encounter: Payer: BLUE CROSS/BLUE SHIELD | Admitting: Family Medicine

## 2016-10-13 ENCOUNTER — Ambulatory Visit (HOSPITAL_COMMUNITY)
Admission: EM | Admit: 2016-10-13 | Discharge: 2016-10-13 | Disposition: A | Discharge status: Home / Self Care | Payer: BLUE CROSS/BLUE SHIELD | Attending: Emergency Medicine | Admitting: Emergency Medicine

## 2016-10-13 ENCOUNTER — Encounter (HOSPITAL_COMMUNITY): Payer: Self-pay | Admitting: *Deleted

## 2016-10-13 DIAGNOSIS — J069 Acute upper respiratory infection, unspecified: Secondary | ICD-10-CM | POA: Diagnosis not present

## 2016-10-13 DIAGNOSIS — B9789 Other viral agents as the cause of diseases classified elsewhere: Secondary | ICD-10-CM

## 2016-10-13 DIAGNOSIS — J4521 Mild intermittent asthma with (acute) exacerbation: Secondary | ICD-10-CM

## 2016-10-13 MED ORDER — PREDNISONE 50 MG PO TABS: ORAL_TABLET | ORAL | 0 refills | Status: DC

## 2016-10-13 MED ORDER — ALBUTEROL SULFATE (2.5 MG/3ML) 0.083% IN NEBU: 2.5000 mg | INHALATION_SOLUTION | Freq: Four times a day (QID) | RESPIRATORY_TRACT | 0 refills | Status: DC | PRN

## 2016-10-13 NOTE — Discharge Instructions (Signed)
You have a cold virus that is flaring up your asthma. Take prednisone daily for the next 5 days. Use your albuterol every 4 hours as needed for wheezing or cough. He should see improvement over the next 2 days. If you develop fevers, trouble breathing, or just not getting better, please come back or go to the ER.

## 2016-10-13 NOTE — ED Provider Notes (Signed)
[facility]    CSN: [id number] Arrival date & time: 10/13/16  1603     History   Chief Complaint Chief Complaint  Patient presents with  . Nasal Congestion  . Cough  . Generalized Body Aches    HPI Corey Hall is a 25 y.o. male.   HPI  He is a 25 year old man here for evaluation of cough and wheezing. His symptoms started 2 days ago with a little runny nose and a scratchy throat. Symptoms progressed to nasal congestion, rhinorrhea, cough, and wheezing. He has also reported some subjective chills and body aches. He is using his home albuterol with temporary improvement. No fevers. He reports chest tightness, but denies shortness of breath. He needs a refill of his albuterol nebulizer.  Past Medical History:  Diagnosis Date  . Allergic rhinitis due to pollen   . Asthma in remission     Patient Active Problem List   Diagnosis Date Noted  . CAP (community acquired pneumonia) 07/24/2016  . Visit for well man health check 03/29/2015  . HLD (hyperlipidemia) 03/29/2015  . GERD (gastroesophageal reflux disease) 03/29/2015  . Screening for STD (sexually transmitted disease) 03/29/2015  . Asthma in remission   . Allergic rhinitis due to pollen     Past Surgical History:  Procedure Laterality Date  . TOOTH EXTRACTION         Home Medications    Prior to Admission medications   Medication Sig Start Date End Date Taking? Authorizing Provider  albuterol (PROVENTIL HFA;VENTOLIN HFA) 108 (90 Base) MCG/ACT inhaler Inhale 1-2 puffs into the lungs every 6 (six) hours as needed for wheezing or shortness of breath. 07/10/16  Yes Kayla Rose, PA-C  fexofenadine (ALLEGRA) 180 MG tablet Take 180 mg by mouth daily.   Yes Historical Provider, MD  fluticasone (VERAMYST) 27.5 MCG/SPRAY nasal spray Place 2 sprays into the nose daily.   Yes Historical Provider, MD  albuterol (PROVENTIL) (2.5 MG/3ML) 0.083% nebulizer solution Take 3 mLs (2.5 mg total) by nebulization every 6  (six) hours as needed for wheezing or shortness of breath. 10/13/16   Charm RingsErin J Shaqueena Mauceri, MD  predniSONE (DELTASONE) 50 MG tablet Take 1 pill daily for 5 days. 10/13/16   Charm RingsErin J Robertine Kipper, MD    Family History Family History  Problem Relation Age of Onset  . Hypertension Father   . Hypertension Maternal Grandmother   . Diabetes Maternal Grandmother   . Heart disease Maternal Grandmother   . Stroke Maternal Grandmother   . Hypertension Paternal Grandmother   . Heart disease Paternal Grandmother   . Cancer Paternal Grandfather     Social History Social History  Substance Use Topics  . Smoking status: Never Smoker  . Smokeless tobacco: Never Used  . Alcohol use Yes     Comment: intermittent     Allergies   Patient has no known allergies.   Review of Systems Review of Systems As in history of present illness  Physical Exam Triage Vital Signs ED Triage Vitals [10/13/16 1757]  Enc Vitals Group     BP      Pulse Rate 90     Resp 21     Temp 99 F (37.2 C)     Temp Source Oral     SpO2 99 %     Weight      Height      Head Circumference      Peak Flow      Pain Score  Pain Loc      Pain Edu?      Excl. in GC?    No data found.   Updated Vital Signs Pulse 90   Temp 99 F (37.2 C) (Oral)   Resp 21   SpO2 99%   Visual Acuity Right Eye Distance:   Left Eye Distance:   Bilateral Distance:    Right Eye Near:   Left Eye Near:    Bilateral Near:     Physical Exam  Constitutional: He is oriented to person, place, and time. He appears well-developed and well-nourished. No distress.  HENT:  Mouth/Throat: Oropharynx is clear and moist. No oropharyngeal exudate.  Nasal mucosa is erythematous. TMs normal bilaterally.  Neck: Neck supple.  Cardiovascular: Normal rate, regular rhythm and normal heart sounds.   No murmur heard. Pulmonary/Chest: Effort normal and breath sounds normal. No respiratory distress. He has no wheezes. He has no rales.  Lymphadenopathy:     He has cervical adenopathy.  Neurological: He is alert and oriented to person, place, and time.     UC Treatments / Results  Labs (all labs ordered are listed, but only abnormal results are displayed) Labs Reviewed - No data to display  EKG  EKG Interpretation None       Radiology No results found.  Procedures Procedures (including critical care time)  Medications Ordered in UC Medications - No data to display   Initial Impression / Assessment and Plan / UC Course  I have reviewed the triage vital signs and the nursing notes.  Pertinent labs & imaging results that were available during my care of the patient were reviewed by me and considered in my medical decision making (see chart for details).  Clinical Course     This is a upper respiratory virus that is flaring up his asthma. We'll treat with 5 days of prednisone. I've refilled his albuterol nebulizer. Return precautions reviewed.  Final Clinical Impressions(s) / UC Diagnoses   Final diagnoses:  Viral URI with cough  Mild intermittent asthma with exacerbation    New Prescriptions Discharge Medication List as of 10/13/2016  6:43 PM       Charm RingsErin J Keith Felten, MD 10/13/16 [phone]

## 2016-10-13 NOTE — ED Triage Notes (Signed)
Patient reports cough, nasal congestion, body aches, and sob. Patient with history of asthma. Patient has been using inhaler and neb treatments at home. Patient is out of out of albuterol solution for nebulizer, would like refill.

## 2016-10-16 ENCOUNTER — Telehealth: Payer: Self-pay | Admitting: Family Medicine

## 2016-10-16 NOTE — Telephone Encounter (Signed)
Patient Name: Corey Hall Gender: Male DOB: 01/07/1991 Age: [age] Y 1 M 26 D Return Phone Number: [phone] (Primary), [phone] (Secondary), [phone] (Alternate) Address: [place]: [place] [id number] Client [facility] Client [facility] [place] Night Physician Ruthe MannanAron, Talia - MD Contact Type Call Who Is Calling Patient / Member / Family / Caregiver Call Type Triage / Clinical Caller Name Constance GoltzConstance Mcelwain Relationship To Patient Mother Return Phone Number [phone] (Primary) Chief Complaint BREATHING - shortness of breath or sounds breathless Reason for Call Symptomatic / Request for Health Information Initial Comment Caller states that her son is having SOB and a HX of asthma Translation No No Triage Reason Other Nurse Assessment Nurse: Willeen CassBennett, RN, Lelon MastSamantha Date/Time (Eastern Time): 10/13/2016 1:11:08 PM Confirm and document reason for call. If symptomatic, describe symptoms. ---Caller states son is completely out of albuterol, he just took his last treatment 1-2 hours ago. Mom is requesting refill on albuterol. [facility] [phone]. Caller is not with pt and RN is unable to conference pt. Pt did not answer. Mom requested a refill on son's nebulizing solution. RN advised she had to verify it has been filled as a maintenance medication with pharmacy. Caller verbalizes understanding and agrees, denies further needs at this time. Does the patient have any new or worsening symptoms? ---Yes Will a triage be completed? ---No Select reason for no triage. ---Other Please document clinical information provided and list any resource used. ---Pt is not with caller and RN unable to reach pt by phone. Guidelines Guideline Title Affirmed Question Affirmed Notes Nurse Date/Time (Eastern Time) Disp. Time Lamount Cohen(Eastern Time) Disposition Final User 10/13/2016 1:08:30 PM Send to Urgent Milton FergusonQueue Costley,  Robert 10/13/2016 1:24:47 PM Pharmacy Call Willeen CassBennett, RN, Lelon MastSamantha Reason: [facility]. Velna HatchetSheila states medication has only been filled once in September by a Cheri FowlerKayla Rose. Velna HatchetSheila could not verify Dr Dayton MartesAron has PLEASE NOTE: All timestamps contained within this report are represented as [place] Standard Time. CONFIDENTIALTY NOTICE: This fax transmission is intended only for the addressee. It contains information that is legally privileged, confidential or otherwise protected from use or disclosure. If you are not the intended recipient, you are strictly prohibited from reviewing, disclosing, copying using or disseminating any of this information or taking any action in reliance on or regarding this information. If you have received this fax in error, please notify us immediately by telephone so that we can arrange for its return to us. Phone: [phone], Toll-Free: [phone], Fax: [phone] Page: 2 of 2 Call Id: [id number] Disp. Time (Eastern Time) Disposition Final User prescribed the medication or that it is a maintenance med. 10/13/2016 1:33:41 PM Clinical Call Yes Willeen CassBennett, RN, Lelon MastSamantha Comments User: Delfina RedwoodSamantha, Bennett, RN Date/Time Lamount Cohen(Eastern Time): 10/13/2016 1:33:34 PM RN attempted to call caller back 2 times after speaking with pharmacy, caller was aware RN would be calling back if unable to verify medication. Caller did not answer, RN left message for caller to call back with further needs.

## 2016-12-03 ENCOUNTER — Ambulatory Visit (INDEPENDENT_AMBULATORY_CARE_PROVIDER_SITE_OTHER): Payer: BLUE CROSS/BLUE SHIELD | Admitting: Family Medicine

## 2016-12-03 ENCOUNTER — Encounter: Payer: Self-pay | Admitting: Family Medicine

## 2016-12-03 VITALS — BP 142/88 | HR 84 | Temp 98.5°F | Wt 218.8 lb

## 2016-12-03 DIAGNOSIS — R300 Dysuria: Secondary | ICD-10-CM

## 2016-12-03 DIAGNOSIS — Z113 Encounter for screening for infections with a predominantly sexual mode of transmission: Secondary | ICD-10-CM

## 2016-12-03 LAB — POC URINALSYSI DIPSTICK (AUTOMATED)
Bilirubin, UA: NEGATIVE
GLUCOSE UA: NEGATIVE
Ketones, UA: NEGATIVE
Leukocytes, UA: NEGATIVE
NITRITE UA: NEGATIVE
PH UA: 7.5
PROTEIN UA: NEGATIVE
RBC UA: NEGATIVE
Spec Grav, UA: 1.02
UROBILINOGEN UA: 0.2

## 2016-12-03 NOTE — Progress Notes (Signed)
Subjective:   Patient ID: Corey Hall, male    DOB: 08/02/1991, 26 y.o.   MRN: [id number]  Corey Hall is a pleasant 26 y.o. year old male who presents to clinic today with Dysuria and SEXUALLY TRANSMITTED DISEASE (testing)  on 12/03/2016  HPI:    Current Outpatient Prescriptions on File Prior to Visit  Medication Sig Dispense Refill  . albuterol (PROVENTIL HFA;VENTOLIN HFA) 108 (90 Base) MCG/ACT inhaler Inhale 1-2 puffs into the lungs every 6 (six) hours as needed for wheezing or shortness of breath. 1 Inhaler 0  . albuterol (PROVENTIL) (2.5 MG/3ML) 0.083% nebulizer solution Take 3 mLs (2.5 mg total) by nebulization every 6 (six) hours as needed for wheezing or shortness of breath. 75 mL 0  . fexofenadine (ALLEGRA) 180 MG tablet Take 180 mg by mouth daily.    . fluticasone (VERAMYST) 27.5 MCG/SPRAY nasal spray Place 2 sprays into the nose daily.    . predniSONE (DELTASONE) 50 MG tablet Take 1 pill daily for 5 days. 5 tablet 0   No current facility-administered medications on file prior to visit.     No Known Allergies  Past Medical History:  Diagnosis Date  . Allergic rhinitis due to pollen   . Asthma in remission     Past Surgical History:  Procedure Laterality Date  . TOOTH EXTRACTION      Family History  Problem Relation Age of Onset  . Hypertension Father   . Hypertension Maternal Grandmother   . Diabetes Maternal Grandmother   . Heart disease Maternal Grandmother   . Stroke Maternal Grandmother   . Hypertension Paternal Grandmother   . Heart disease Paternal Grandmother   . Cancer Paternal Grandfather     Social History   Social History  . Marital status: Single    Spouse name: N/A  . Number of children: N/A  . Years of education: N/A   Occupational History  . Student     [employer]   Social History Main Topics  . Smoking status: Never Smoker  . Smokeless tobacco: Never Used  . Alcohol use Yes     Comment: intermittent  . Drug use:  Yes     Comment: MJ, intermittent  . Sexual activity: Yes    Partners: Female   Other Topics Concern  . Not on file   Social History Narrative   Soph at [employer]   Exercise and Sports Science Major   Enjoys working out and [employer] Energylifting weights   The PMH, PSH, Social History, Family History, Medications, and allergies have been reviewed [facility], and have been updated if relevant.   Review of Systems  Genitourinary: Positive for dysuria. Negative for decreased urine volume, discharge, frequency, hematuria, penile pain, penile swelling, scrotal swelling, testicular pain and urgency.       Objective:    BP (!) 142/88   Pulse 84   Temp 98.5 F (36.9 C) (Oral)   Wt 218 lb 12 oz (99.2 kg)   SpO2 99%   BMI 30.51 kg/m    Physical Exam  Constitutional: He is oriented to person, place, and time. He appears well-developed and well-nourished. No distress.  HENT:  Head: Normocephalic.  Eyes: Conjunctivae are normal.  Pulmonary/Chest: Effort normal.  Abdominal: Soft. He exhibits no distension. There is no tenderness.  Neurological: He is alert and oriented to person, place, and time. No cranial nerve deficit.  Skin: Skin is warm and dry. He is not diaphoretic.  Psychiatric: He has a normal mood and affect. His behavior is normal. Judgment and thought content normal.  Nursing note and vitals reviewed.         Assessment & Plan:   Screening for STD (sexually transmitted disease) - Plan: HIV antibody (with reflex), RPR, GC/chlamydia probe amp, urine No Follow-up on file.

## 2016-12-03 NOTE — Addendum Note (Signed)
Addended by: Alvina ChouWALSH, TERRI J on: 12/03/2016 04:25 PM   Modules accepted: Orders

## 2016-12-03 NOTE — Progress Notes (Addendum)
Subjective:   Patient ID: Corey Hall, male    DOB: 10/31/1990, 26 y.o.   MRN: [id number]  Corey Hall is a pleasant 26 y.o. year old male who presents to clinic today with Dysuria and SEXUALLY TRANSMITTED DISEASE (testing)  on 12/03/2016  HPI:  Intermittent dysuria for months.  No penile discharge, hematuria or back pain.  Sexually active with partner only.      Current Outpatient Prescriptions on File Prior to Visit  Medication Sig Dispense Refill  . albuterol (PROVENTIL HFA;VENTOLIN HFA) 108 (90 Base) MCG/ACT inhaler Inhale 1-2 puffs into the lungs every 6 (six) hours as needed for wheezing or shortness of breath. 1 Inhaler 0  . albuterol (PROVENTIL) (2.5 MG/3ML) 0.083% nebulizer solution Take 3 mLs (2.5 mg total) by nebulization every 6 (six) hours as needed for wheezing or shortness of breath. 75 mL 0  . fexofenadine (ALLEGRA) 180 MG tablet Take 180 mg by mouth daily.    . fluticasone (VERAMYST) 27.5 MCG/SPRAY nasal spray Place 2 sprays into the nose daily.    . predniSONE (DELTASONE) 50 MG tablet Take 1 pill daily for 5 days. 5 tablet 0   No current facility-administered medications on file prior to visit.     No Known Allergies  Past Medical History:  Diagnosis Date  . Allergic rhinitis due to pollen   . Asthma in remission     Past Surgical History:  Procedure Laterality Date  . TOOTH EXTRACTION      Family History  Problem Relation Age of Onset  . Hypertension Father   . Hypertension Maternal Grandmother   . Diabetes Maternal Grandmother   . Heart disease Maternal Grandmother   . Stroke Maternal Grandmother   . Hypertension Paternal Grandmother   . Heart disease Paternal Grandmother   . Cancer Paternal Grandfather     Social History   Social History  . Marital status: Single    Spouse name: N/A  . Number of children: N/A  . Years of education: N/A   Occupational History  . Student     [employer]   Social History Main Topics  .  Smoking status: Never Smoker  . Smokeless tobacco: Never Used  . Alcohol use Yes     Comment: intermittent  . Drug use: Yes     Comment: MJ, intermittent  . Sexual activity: Yes    Partners: Female   Other Topics Concern  . Not on file   Social History Narrative   Soph at [employer]   Exercise and Sports Science Major   Enjoys working out and [employer] weights   The PMH, PSH, Social History, Family History, Medications, and allergies have been reviewed [facility], and have been updated if relevant.   Review of Systems  Constitutional: Negative.   Gastrointestinal: Negative.   Genitourinary: Positive for dysuria. Negative for decreased urine volume, discharge, enuresis, flank pain, frequency, genital sores, hematuria, penile pain, penile swelling, scrotal swelling, testicular pain and urgency.  All other systems reviewed and are negative.      Objective:    BP (!) 142/88   Pulse 84   Temp 98.5 F (36.9 C) (Oral)   Wt 218 lb 12 oz (99.2 kg)   SpO2 99%   BMI 30.51 kg/m    Physical Exam  Constitutional: He is oriented to person, place, and time. He appears well-developed and well-nourished. No distress.  HENT:  Head: Normocephalic and atraumatic.  Eyes: Conjunctivae are normal.  Cardiovascular: Normal rate.   Pulmonary/Chest: Effort normal.  Abdominal: Soft.  Neurological: He is alert and oriented to person, place, and time. No cranial nerve deficit.  Skin: Skin is warm and dry. He is not diaphoretic.  Psychiatric: He has a normal mood and affect. His behavior is normal. Judgment and thought content normal.  Nursing note and vitals reviewed.         Assessment & Plan:   Screening for STD (sexually transmitted disease) - Plan: HIV antibody (with reflex), RPR, GC/Chlamydia Probe Amp, CANCELED: GC/chlamydia probe amp, urine  Dysuria - Plan: POCT Urinalysis Dipstick (Automated) No Follow-up on file.

## 2016-12-03 NOTE — Addendum Note (Signed)
Addended by: Desmond DikeKNIGHT, Jakevion Arney H on: 12/03/2016 05:04 PM   Modules accepted: Orders

## 2016-12-03 NOTE — Progress Notes (Signed)
Pre visit review using our clinic review tool, if applicable. No additional management support is needed unless otherwise documented below in the visit note. 

## 2016-12-03 NOTE — Patient Instructions (Signed)
Great to see you. We will call you with your results from today. 

## 2016-12-04 DIAGNOSIS — R300 Dysuria: Secondary | ICD-10-CM | POA: Insufficient documentation

## 2016-12-04 LAB — GC/CHLAMYDIA PROBE AMP
CT Probe RNA: NOT DETECTED
GC Probe RNA: NOT DETECTED

## 2016-12-04 LAB — RPR

## 2016-12-04 LAB — HIV ANTIBODY (ROUTINE TESTING W REFLEX): HIV: NONREACTIVE

## 2016-12-04 NOTE — Assessment & Plan Note (Signed)
UA neg- concern for STD. Orders placed for STD screening. The patient indicates understanding of these issues and agrees with the plan. Orders Placed This Encounter  Procedures  . GC/Chlamydia Probe Amp  . HIV antibody (with reflex)  . RPR  . POCT Urinalysis Dipstick (Automated)

## 2016-12-28 ENCOUNTER — Other Ambulatory Visit: Payer: Self-pay | Admitting: *Deleted

## 2016-12-28 MED ORDER — ALBUTEROL SULFATE (2.5 MG/3ML) 0.083% IN NEBU: 2.5000 mg | INHALATION_SOLUTION | Freq: Four times a day (QID) | RESPIRATORY_TRACT | 5 refills | Status: DC | PRN

## 2016-12-28 MED ORDER — ALBUTEROL SULFATE HFA 108 (90 BASE) MCG/ACT IN AERS: 1.0000 | INHALATION_SPRAY | Freq: Four times a day (QID) | RESPIRATORY_TRACT | 5 refills | Status: DC | PRN

## 2017-01-09 ENCOUNTER — Encounter: Payer: Self-pay | Admitting: Family Medicine

## 2017-01-09 ENCOUNTER — Ambulatory Visit (INDEPENDENT_AMBULATORY_CARE_PROVIDER_SITE_OTHER): Payer: BLUE CROSS/BLUE SHIELD | Admitting: Family Medicine

## 2017-01-09 VITALS — BP 110/70 | HR 98 | Temp 98.6°F | Ht 71.0 in | Wt 218.5 lb

## 2017-01-09 DIAGNOSIS — L309 Dermatitis, unspecified: Secondary | ICD-10-CM | POA: Diagnosis not present

## 2017-01-09 MED ORDER — METRONIDAZOLE 0.75 % EX CREA: TOPICAL_CREAM | Freq: Two times a day (BID) | CUTANEOUS | 0 refills | Status: DC

## 2017-01-09 NOTE — Progress Notes (Signed)
Pre visit review using our clinic review tool, if applicable. No additional management support is needed unless otherwise documented below in the visit note. 

## 2017-01-09 NOTE — Progress Notes (Signed)
Dr. Karleen HampshireSpencer T. Nyia Tsao, MD, CAQ Sports Medicine [facility] [address] [place], [address] Phone: [phone] Fax: [phone]  01/09/2017  Patient: Corey LemmaMatthew E Hall, MRN: [id number], DOB: 05/29/1991, 26 y.o.  Primary Physician:  Ruthe Mannanalia Aron, MD   Chief Complaint  Patient presents with  . Rash    Face   Subjective:   Corey Hall is a 26 y.o. very pleasant male patient who presents with the following:  Break out on face.  Tried some coconut oil on it.  This is been there for about a week. He had a new lotion that he used over the last week. It is mildly uncomfortable, and it is not itchy.   Past Medical History, Surgical History, Social History, Family History, Problem List, Medications, and Allergies have been reviewed and updated if relevant.  Patient Active Problem List   Diagnosis Date Noted  . Dysuria 12/04/2016  . CAP (community acquired pneumonia) 07/24/2016  . HLD (hyperlipidemia) 03/29/2015  . GERD (gastroesophageal reflux disease) 03/29/2015  . Screening for STD (sexually transmitted disease) 03/29/2015  . Asthma in remission   . Allergic rhinitis due to pollen     Past Medical History:  Diagnosis Date  . Allergic rhinitis due to pollen   . Asthma in remission     Past Surgical History:  Procedure Laterality Date  . TOOTH EXTRACTION      Social History   Social History  . Marital status: Single    Spouse name: N/A  . Number of children: N/A  . Years of education: N/A   Occupational History  . Student     [employer]   Social History Main Topics  . Smoking status: Never Smoker  . Smokeless tobacco: Never Used  . Alcohol use Yes     Comment: intermittent  . Drug use: Yes     Comment: MJ, intermittent  . Sexual activity: Yes    Partners: Female   Other Topics Concern  . Not on file   Social History Narrative   Soph at [employer]   Exercise and Sports Science Major   Enjoys working out and [employer]  weights    Family History  Problem Relation Age of Onset  . Hypertension Father   . Hypertension Maternal Grandmother   . Diabetes Maternal Grandmother   . Heart disease Maternal Grandmother   . Stroke Maternal Grandmother   . Hypertension Paternal Grandmother   . Heart disease Paternal Grandmother   . Cancer Paternal Grandfather     No Known Allergies  Medication list reviewed and updated in full in [place] [facility].   GEN: No acute illnesses, no fevers, chills. GI: No n/v/d, eating normally Pulm: No SOB Interactive and getting along well at home.  Otherwise, ROS is as per the HPI.  Objective:   BP 110/70   Pulse 98   Temp 98.6 F (37 C) (Oral)   Ht 5\' 11"  (1.803 m)   Wt 218 lb 8 oz (99.1 kg)   BMI 30.47 kg/m   GEN: WDWN, NAD, Non-toxic, A & O x 3 HEENT: Atraumatic, Normocephalic. Neck supple. No masses, No LAD. Ears and Nose: No external deformity. EXTR: No c/c/e NEURO Normal gait.  PSYCH: Normally interactive. Conversant. Not depressed or anxious appearing.  Calm demeanor.  SKIN: Small palms with very small comedonal-type appearance around the upper cheeks and forehead region.  Laboratory and Imaging Data:  Assessment and Plan:  Periorbital dermatitis  Probable periorbital dermatitis based on exam. May be reactionary in character secondary to topical cream.  I have asked him to call me next week if he is not improving.  Follow-up: No Follow-up on file.  Meds ordered this encounter  Medications  . metroNIDAZOLE (METROCREAM) 0.75 % cream    Sig: Apply topically 2 (two) times daily.    Dispense:  45 g    Refill:  0   Signed,  Marjory Meints T. Detrick Dani, MD   Allergies as of 01/09/2017   No Known Allergies     Medication List       Accurate as of 01/09/17  4:03 PM. Always use your most recent med list.          albuterol 108 (90 Base) MCG/ACT inhaler Commonly known as:  PROVENTIL HFA;VENTOLIN HFA Inhale 1-2 puffs into the lungs every 6 (six)  hours as needed for wheezing or shortness of breath.   albuterol (2.5 MG/3ML) 0.083% nebulizer solution Commonly known as:  PROVENTIL Take 3 mLs (2.5 mg total) by nebulization every 6 (six) hours as needed for wheezing or shortness of breath.   fexofenadine 180 MG tablet Commonly known as:  ALLEGRA Take 180 mg by mouth daily.   fluticasone 27.5 MCG/SPRAY nasal spray Commonly known as:  VERAMYST Place 2 sprays into the nose daily.   metroNIDAZOLE 0.75 % cream Commonly known as:  METROCREAM Apply topically 2 (two) times daily.

## 2017-02-07 ENCOUNTER — Encounter (HOSPITAL_COMMUNITY): Payer: Self-pay | Admitting: Emergency Medicine

## 2017-02-07 ENCOUNTER — Ambulatory Visit (HOSPITAL_COMMUNITY)
Admission: EM | Admit: 2017-02-07 | Discharge: 2017-02-07 | Disposition: A | Discharge status: Home / Self Care | Payer: BLUE CROSS/BLUE SHIELD | Attending: Internal Medicine | Admitting: Internal Medicine

## 2017-02-07 DIAGNOSIS — K219 Gastro-esophageal reflux disease without esophagitis: Secondary | ICD-10-CM | POA: Diagnosis not present

## 2017-02-07 DIAGNOSIS — J301 Allergic rhinitis due to pollen: Secondary | ICD-10-CM | POA: Diagnosis not present

## 2017-02-07 DIAGNOSIS — J45909 Unspecified asthma, uncomplicated: Secondary | ICD-10-CM | POA: Diagnosis not present

## 2017-02-07 DIAGNOSIS — S39011A Strain of muscle, fascia and tendon of abdomen, initial encounter: Secondary | ICD-10-CM

## 2017-02-07 DIAGNOSIS — R300 Dysuria: Secondary | ICD-10-CM

## 2017-02-07 DIAGNOSIS — N390 Urinary tract infection, site not specified: Secondary | ICD-10-CM | POA: Diagnosis present

## 2017-02-07 DIAGNOSIS — R109 Unspecified abdominal pain: Secondary | ICD-10-CM | POA: Diagnosis not present

## 2017-02-07 DIAGNOSIS — E785 Hyperlipidemia, unspecified: Secondary | ICD-10-CM | POA: Insufficient documentation

## 2017-02-07 LAB — POCT URINALYSIS DIP (DEVICE)
Bilirubin Urine: NEGATIVE
GLUCOSE, UA: NEGATIVE mg/dL
Hgb urine dipstick: NEGATIVE
KETONES UR: NEGATIVE mg/dL
Leukocytes, UA: NEGATIVE
NITRITE: NEGATIVE
PH: 7 (ref 5.0–8.0)
PROTEIN: NEGATIVE mg/dL
Specific Gravity, Urine: 1.02 (ref 1.005–1.030)
UROBILINOGEN UA: 0.2 mg/dL (ref 0.0–1.0)

## 2017-02-07 NOTE — ED Provider Notes (Signed)
[facility]    CSN: [id number] Arrival date & time: 02/07/17  1912     History   Chief Complaint Chief Complaint  Patient presents with  . Abdominal Pain  . Urinary Tract Infection    HPI Corey Hall is a 26 y.o. male.   Pt c/o abdominal pain and dysuria x3 days.  Denies N/V/D or fever.  Also denies discharge.  Exercises daily      Past Medical History:  Diagnosis Date  . Allergic rhinitis due to pollen   . Asthma in remission     Patient Active Problem List   Diagnosis Date Noted  . HLD (hyperlipidemia) 03/29/2015  . GERD (gastroesophageal reflux disease) 03/29/2015  . Screening for STD (sexually transmitted disease) 03/29/2015  . Asthma in remission   . Allergic rhinitis due to pollen     Past Surgical History:  Procedure Laterality Date  . TOOTH EXTRACTION         Home Medications    Prior to Admission medications   Medication Sig Start Date End Date Taking? Authorizing Provider  albuterol (PROVENTIL HFA;VENTOLIN HFA) 108 (90 Base) MCG/ACT inhaler Inhale 1-2 puffs into the lungs every 6 (six) hours as needed for wheezing or shortness of breath. 12/28/16   Dianne Dun, MD  albuterol (PROVENTIL) (2.5 MG/3ML) 0.083% nebulizer solution Take 3 mLs (2.5 mg total) by nebulization every 6 (six) hours as needed for wheezing or shortness of breath. 12/28/16   Dianne Dun, MD  fexofenadine (ALLEGRA) 180 MG tablet Take 180 mg by mouth daily.    Historical Provider, MD  fluticasone (VERAMYST) 27.5 MCG/SPRAY nasal spray Place 2 sprays into the nose daily.    Historical Provider, MD  metroNIDAZOLE (METROCREAM) 0.75 % cream Apply topically 2 (two) times daily. 01/09/17   Hannah Beat, MD    Family History Family History  Problem Relation Age of Onset  . Hypertension Father   . Hypertension Maternal Grandmother   . Diabetes Maternal Grandmother   . Heart disease Maternal Grandmother   . Stroke Maternal Grandmother   . Hypertension Paternal  Grandmother   . Heart disease Paternal Grandmother   . Cancer Paternal Grandfather     Social History Social History  Substance Use Topics  . Smoking status: Never Smoker  . Smokeless tobacco: Never Used  . Alcohol use Yes     Comment: intermittent     Allergies   Patient has no known allergies.   Review of Systems Review of Systems  Constitutional: Negative for chills and fever.  HENT: Negative for sore throat and tinnitus.   Eyes: Negative for redness.  Respiratory: Negative for cough and shortness of breath.   Cardiovascular: Negative for chest pain and palpitations.  Gastrointestinal: Positive for abdominal pain. Negative for abdominal distention, diarrhea, nausea and vomiting.  Genitourinary: Positive for dysuria. Negative for frequency and urgency.  Musculoskeletal: Negative for myalgias.  Skin: Negative for rash.       No lesions  Neurological: Negative for weakness.  Hematological: Does not bruise/bleed easily.  Psychiatric/Behavioral: Negative for suicidal ideas.     Physical Exam Triage Vital Signs ED Triage Vitals  Enc Vitals Group     BP 02/07/17 1933 (!) 148/74     Pulse Rate 02/07/17 1933 73     Resp 02/07/17 1933 18     Temp 02/07/17 1933 98.9 F (37.2 C)     Temp Source 02/07/17 1933 Oral     SpO2 02/07/17 1933 97 %  Weight --      Height --      Head Circumference --      Peak Flow --      Pain Score 02/07/17 1931 8     Pain Loc --      Pain Edu? --      Excl. in GC? --    No data found.   Updated Vital Signs BP (!) 148/74 (BP Location: Left Arm) Comment (BP Location): large cuff  Pulse 73   Temp 98.9 F (37.2 C) (Oral)   Resp 18   SpO2 97%   Visual Acuity Right Eye Distance:   Left Eye Distance:   Bilateral Distance:    Right Eye Near:   Left Eye Near:    Bilateral Near:     Physical Exam  Constitutional: He is oriented to person, place, and time. He appears well-developed and well-nourished. No distress.  HENT:    Head: Normocephalic and atraumatic.  Mouth/Throat: Oropharynx is clear and moist.  Eyes: Conjunctivae and EOM are normal. Pupils are equal, round, and reactive to light. No scleral icterus.  Neck: Normal range of motion. Neck supple. No JVD present. No tracheal deviation present. No thyromegaly present.  Cardiovascular: Normal rate, regular rhythm and normal heart sounds.  Exam reveals no gallop and no friction rub.   No murmur heard. Pulmonary/Chest: Effort normal and breath sounds normal. No respiratory distress.  Abdominal: Soft. Bowel sounds are normal. He exhibits no distension and no mass. There is tenderness (lower right flank). There is no rebound and no guarding. No hernia.  Musculoskeletal: Normal range of motion. He exhibits no edema.  Lymphadenopathy:    He has no cervical adenopathy.  Neurological: He is alert and oriented to person, place, and time. No cranial nerve deficit.  Skin: Skin is warm and dry. No rash noted. No erythema.  Psychiatric: He has a normal mood and affect. His behavior is normal. Judgment and thought content normal.     UC Treatments / Results  Labs (all labs ordered are listed, but only abnormal results are displayed) Labs Reviewed  POCT URINALYSIS DIP (DEVICE)  URINE CYTOLOGY ANCILLARY ONLY    EKG  EKG Interpretation None       Radiology No results found.  Procedures Procedures (including critical care time)  Medications Ordered in UC Medications - No data to display   Initial Impression / Assessment and Plan / UC Course  I have reviewed the triage vital signs and the nursing notes.  Pertinent labs & imaging results that were available during my care of the patient were reviewed by me and considered in my medical decision making (see chart for details).     UA negative. Likely oblique strain; ddx includes appendicitis but less likely. R/o STI.  Final Clinical Impressions(s) / UC Diagnoses   Final diagnoses:  Abdominal muscle  strain, initial encounter  Dysuria    New Prescriptions New Prescriptions   No medications on file     Arnaldo Natal, MD 02/07/17 2030

## 2017-02-07 NOTE — ED Triage Notes (Signed)
Complains of right lower abdominal pain for a week and pain with urination.  Symptoms worsened today

## 2017-02-08 LAB — URINE CYTOLOGY ANCILLARY ONLY
Chlamydia: NEGATIVE
Neisseria Gonorrhea: NEGATIVE
Trichomonas: NEGATIVE

## 2017-04-30 ENCOUNTER — Ambulatory Visit (INDEPENDENT_AMBULATORY_CARE_PROVIDER_SITE_OTHER): Payer: BLUE CROSS/BLUE SHIELD | Admitting: Family Medicine

## 2017-04-30 ENCOUNTER — Encounter: Payer: Self-pay | Admitting: Family Medicine

## 2017-04-30 DIAGNOSIS — L02419 Cutaneous abscess of limb, unspecified: Secondary | ICD-10-CM | POA: Insufficient documentation

## 2017-04-30 MED ORDER — DOXYCYCLINE HYCLATE 100 MG PO TABS: 100.0000 mg | ORAL_TABLET | Freq: Two times a day (BID) | ORAL | 0 refills | Status: DC

## 2017-04-30 NOTE — Progress Notes (Signed)
Subjective:   Patient ID: Corey Hall, male    DOB: 05/16/1991, 26 y.o.   MRN: [id number]  Corey Hall is a pleasant 26 y.o. year old male who presents to clinic today with Mass  on 04/30/2017  HPI:  Mass left axilla- started out a small pimple last week. No larger and more painful. TTP, feels warm.  No drainage from it.  Has not tried anything for it.  Has never had anything like this before.  No fever, nausea or vomiting.   Current Outpatient Prescriptions on File Prior to Visit  Medication Sig Dispense Refill  . albuterol (PROVENTIL HFA;VENTOLIN HFA) 108 (90 Base) MCG/ACT inhaler Inhale 1-2 puffs into the lungs every 6 (six) hours as needed for wheezing or shortness of breath. 1 Inhaler 5  . albuterol (PROVENTIL) (2.5 MG/3ML) 0.083% nebulizer solution Take 3 mLs (2.5 mg total) by nebulization every 6 (six) hours as needed for wheezing or shortness of breath. 75 mL 5  . fexofenadine (ALLEGRA) 180 MG tablet Take 180 mg by mouth daily.    . fluticasone (VERAMYST) 27.5 MCG/SPRAY nasal spray Place 2 sprays into the nose daily.    . metroNIDAZOLE (METROCREAM) 0.75 % cream Apply topically 2 (two) times daily. 45 g 0   No current facility-administered medications on file prior to visit.     No Known Allergies  Past Medical History:  Diagnosis Date  . Allergic rhinitis due to pollen   . Asthma in remission     Past Surgical History:  Procedure Laterality Date  . TOOTH EXTRACTION      Family History  Problem Relation Age of Onset  . Hypertension Father   . Hypertension Maternal Grandmother   . Diabetes Maternal Grandmother   . Heart disease Maternal Grandmother   . Stroke Maternal Grandmother   . Hypertension Paternal Grandmother   . Heart disease Paternal Grandmother   . Cancer Paternal Grandfather     Social History   Social History  . Marital status: Single    Spouse name: N/A  . Number of children: N/A  . Years of education: N/A    Occupational History  . Student     [employer]   Social History Main Topics  . Smoking status: Never Smoker  . Smokeless tobacco: Never Used  . Alcohol use Yes     Comment: intermittent  . Drug use: Yes     Comment: MJ, intermittent  . Sexual activity: Yes    Partners: Female   Other Topics Concern  . 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   Review of Systems  Constitutional: Negative.   Cardiovascular: Negative.   Gastrointestinal: Negative.   Musculoskeletal: Negative.   Neurological: Negative.   Hematological: Negative.   All other systems reviewed and are negative.      Objective:    BP 120/80   Pulse 84   Ht 6' (1.829 m)   Wt 207 lb (93.9 kg)   SpO2 98%   BMI 28.07 kg/m    Physical Exam  Constitutional: He appears well-developed and well-nourished. No distress.  HENT:  Head: Normocephalic and atraumatic.  Eyes: Conjunctivae are normal.  Cardiovascular: Normal rate.   Pulmonary/Chest:  Effort normal.  Musculoskeletal: Normal range of motion.  Neurological: He is alert.  Skin: Skin is warm and dry. He is not diaphoretic.     Psychiatric: He has a normal mood and affect. His behavior is normal. Judgment and thought content normal.  Nursing note and vitals reviewed.         Assessment & Plan:   Axillary abscess No Follow-up on file.

## 2017-04-30 NOTE — Assessment & Plan Note (Signed)
New- non fluctuant, no indication for I and D today. Place on oral abx- doxycyline 100 mg twice daily x 7 days, warm compresses. Call or return to clinic prn if these symptoms worsen or fail to improve as anticipated. The patient indicates understanding of these issues and agrees with the plan.

## 2017-05-28 ENCOUNTER — Ambulatory Visit: Payer: BLUE CROSS/BLUE SHIELD | Admitting: Family Medicine

## 2017-05-28 ENCOUNTER — Telehealth: Payer: Self-pay

## 2017-05-28 NOTE — Telephone Encounter (Signed)
Pt has appt to see Dr Para Marchuncan 05/28/17 at 3PM.

## 2017-05-28 NOTE — Telephone Encounter (Signed)
Patient cancelled appointment.

## 2017-05-28 NOTE — Telephone Encounter (Signed)
PLEASE NOTE: All timestamps contained within this report are represented as [place] Standard Time. CONFIDENTIALTY NOTICE: This fax transmission is intended only for the addressee. It contains information that is legally privileged, confidential or otherwise protected from use or disclosure. If you are not the intended recipient, you are strictly prohibited from reviewing, disclosing, copying using or disseminating any of this information or taking any action in reliance on or regarding this information. If you have received this fax in error, please notify us immediately by telephone so that we can arrange for its return to us. Phone: [phone], Toll-Free: [phone], Fax: [phone] Page: 1 of 1 Call Id: [id number] [facility] - Client Nonclinical Telephone Record [facility] Client [facility] - [facility] - Night Physician [facility] - PHYSICIAN, Crissie FiguresUNKNOWN- MD Contact Type Call Who Is Calling Patient / Member / Family / Caregiver Caller Name Corey GrimesMatthew Mcpheeters Caller Phone Number [phone] Patient Name Corey Hall Call Type Message Only Information Provided Reason for Call Request to Schedule Office Appointment Initial Comment Caller said he wants to make an appt for tomorrow. Additional Comment Call Closed By: Lyda PeroneLance Hooper Transaction Date/Time: 05/27/2017 5:00:24 PM (ET)

## 2017-05-30 ENCOUNTER — Ambulatory Visit (INDEPENDENT_AMBULATORY_CARE_PROVIDER_SITE_OTHER): Payer: BLUE CROSS/BLUE SHIELD | Admitting: Family Medicine

## 2017-05-30 ENCOUNTER — Encounter: Payer: Self-pay | Admitting: Family Medicine

## 2017-05-30 DIAGNOSIS — Z113 Encounter for screening for infections with a predominantly sexual mode of transmission: Secondary | ICD-10-CM | POA: Diagnosis not present

## 2017-05-30 DIAGNOSIS — R1031 Right lower quadrant pain: Secondary | ICD-10-CM

## 2017-05-30 NOTE — Addendum Note (Signed)
Addended by: Alvina ChouWALSH, TERRI J on: 05/30/2017 08:37 AM   Modules accepted: Orders

## 2017-05-30 NOTE — Progress Notes (Signed)
Subjective:   Patient ID: Corey Hall, male    DOB: January 25, 1991, 26 y.o.   MRN: [id number]  Corey Hall is a pleasant 26 y.o. year old male who presents to clinic today with Abdominal Pain (Recurrent Lower Right Side Pain)  on 05/30/2017  HPI:  Right lower abdominal pain-  Intermittent for several months.  Seems to be worse after he eats. No known injury but he is an avid weight lifter. No nausea or vomiting. No changes in bowel habits. No fevers.  Would like STD testing today too.  Denies any penile discharge or dysuria.   Current Outpatient Prescriptions on File Prior to Visit  Medication Sig Dispense Refill  . albuterol (PROVENTIL HFA;VENTOLIN HFA) 108 (90 Base) MCG/ACT inhaler Inhale 1-2 puffs into the lungs every 6 (six) hours as needed for wheezing or shortness of breath. 1 Inhaler 5  . albuterol (PROVENTIL) (2.5 MG/3ML) 0.083% nebulizer solution Take 3 mLs (2.5 mg total) by nebulization every 6 (six) hours as needed for wheezing or shortness of breath. 75 mL 5  . doxycycline (VIBRA-TABS) 100 MG tablet Take 1 tablet (100 mg total) by mouth 2 (two) times daily. 14 tablet 0  . fexofenadine (ALLEGRA) 180 MG tablet Take 180 mg by mouth daily.    . fluticasone (VERAMYST) 27.5 MCG/SPRAY nasal spray Place 2 sprays into the nose daily.    . metroNIDAZOLE (METROCREAM) 0.75 % cream Apply topically 2 (two) times daily. 45 g 0   No current facility-administered medications on file prior to visit.     No Known Allergies  Past Medical History:  Diagnosis Date  . Allergic rhinitis due to pollen   . Asthma in remission     Past Surgical History:  Procedure Laterality Date  . TOOTH EXTRACTION      Family History  Problem Relation Age of Onset  . Hypertension Father   . Hypertension Maternal Grandmother   . Diabetes Maternal Grandmother   . Heart disease Maternal Grandmother   . Stroke Maternal Grandmother   . Hypertension Paternal Grandmother   . Heart disease  Paternal Grandmother   . Cancer Paternal Grandfather     Social History   Social History  . Marital status: Single    Spouse name: N/A  . Number of children: N/A  . Years of education: N/A   Occupational History  . Student     [employer]   Social History Main Topics  . Smoking status: Never Smoker  . Smokeless tobacco: Never Used  . Alcohol use Yes     Comment: intermittent  . Drug use: Yes     Comment: MJ, intermittent  . Sexual activity: Yes    Partners: Female   Other Topics Concern  . Not on file   Social History Narrative   Soph at [employer]   Exercise and Sports Science Major   Enjoys working out and [employer]   The PMH, PSH, Social History, Family History, Medications, and allergies have been reviewed in [facility], and have been updated if relevant.  Review of Systems  Constitutional: Negative.   Gastrointestinal: Positive for abdominal pain. Negative for abdominal distention, anal bleeding, blood in stool, constipation, diarrhea, nausea, rectal pain and vomiting.  Genitourinary: Negative.   All other systems reviewed and are negative.      Objective:    BP 124/82   Pulse 73   Temp 98.4 F (36.9 C)   Wt 203 lb (92.1 kg)  SpO2 99%   BMI 27.53 kg/m    Physical Exam  Constitutional: He is oriented to person, place, and time. He appears well-developed and well-nourished. No distress.  HENT:  Head: Normocephalic and atraumatic.  Eyes: Conjunctivae are normal.  Cardiovascular: Normal rate.   Pulmonary/Chest: Effort normal.  Abdominal: Soft. Bowel sounds are normal. He exhibits no distension and no mass. There is no tenderness. There is no rebound and no guarding.  Neurological: He is alert and oriented to person, place, and time. No cranial nerve deficit.  Skin: Skin is warm and dry. He is not diaphoretic.  Psychiatric: He has a normal mood and affect. His behavior is normal. Judgment and thought content normal.  Nursing note and vitals  reviewed.         Assessment & Plan:   Right lower quadrant abdominal pain - Plan: US Abdomen Complete  Screening examination for STD (sexually transmitted disease) - Plan: HIV antibody (with reflex), RPR, Chlamydia trachomatis RNA, Urine No Follow-up on file.

## 2017-05-30 NOTE — Patient Instructions (Signed)
Please stop by up front to schedule your ultrasound

## 2017-05-30 NOTE — Assessment & Plan Note (Signed)
Persistent- ? Muscular strain but duration of symptoms is concerning. Will get imaging of abdomen- not consistent with acute appendicitis. US of abdomen. May need to proceed with CT of abdomen if US is unremarkable. The patient indicates understanding of these issues and agrees with the plan. Orders Placed This Encounter  Procedures  . Chlamydia trachomatis RNA, Urine  . US Abdomen Complete  . HIV antibody (with reflex)  . RPR

## 2017-05-31 LAB — HIV ANTIBODY (ROUTINE TESTING W REFLEX): HIV 1&2 Ab, 4th Generation: NONREACTIVE

## 2017-05-31 LAB — RPR

## 2017-05-31 LAB — GC/CHLAMYDIA PROBE AMP
CT PROBE, AMP APTIMA: NOT DETECTED
GC Probe RNA: NOT DETECTED

## 2017-06-03 LAB — TRICHOMONAS VAGINALIS RNA, QL,MALES: Trichomonas vaginalis RNA: NOT DETECTED

## 2017-06-04 ENCOUNTER — Other Ambulatory Visit: Payer: BLUE CROSS/BLUE SHIELD

## 2017-12-18 ENCOUNTER — Emergency Department (HOSPITAL_COMMUNITY)
Admission: EM | Admit: 2017-12-18 | Discharge: 2017-12-18 | Discharge status: Against Medical Advice | Payer: BLUE CROSS/BLUE SHIELD

## 2017-12-19 ENCOUNTER — Ambulatory Visit: Payer: BLUE CROSS/BLUE SHIELD | Admitting: Family Medicine

## 2017-12-19 ENCOUNTER — Encounter: Payer: Self-pay | Admitting: Family Medicine

## 2017-12-19 VITALS — BP 110/78 | HR 62 | Temp 97.8°F | Ht 72.0 in | Wt 178.0 lb

## 2017-12-19 DIAGNOSIS — K589 Irritable bowel syndrome without diarrhea: Secondary | ICD-10-CM | POA: Diagnosis not present

## 2017-12-19 DIAGNOSIS — R1084 Generalized abdominal pain: Secondary | ICD-10-CM

## 2017-12-19 LAB — COMPREHENSIVE METABOLIC PANEL
ALBUMIN: 4.2 g/dL (ref 3.5–5.2)
ALK PHOS: 39 U/L (ref 39–117)
ALT: 24 U/L (ref 0–53)
AST: 22 U/L (ref 0–37)
BILIRUBIN TOTAL: 0.6 mg/dL (ref 0.2–1.2)
BUN: 24 mg/dL — ABNORMAL HIGH (ref 6–23)
CALCIUM: 10.2 mg/dL (ref 8.4–10.5)
CO2: 31 mEq/L (ref 19–32)
Chloride: 102 mEq/L (ref 96–112)
Creatinine, Ser: 1.42 mg/dL (ref 0.40–1.50)
GFR: 77.91 mL/min (ref >60.00)
Glucose, Bld: 90 mg/dL (ref 70–99)
Potassium: 4.9 mEq/L (ref 3.5–5.1)
Sodium: 138 mEq/L (ref 135–145)
TOTAL PROTEIN: 7.3 g/dL (ref 6.0–8.3)

## 2017-12-19 LAB — CBC
HCT: 44.8 % (ref 39.0–52.0)
Hemoglobin: 14.9 g/dL (ref 13.0–17.0)
MCHC: 33.3 g/dL (ref 30.0–36.0)
MCV: 86.4 fl (ref 78.0–100.0)
PLATELETS: 265 10*3/uL (ref 150.0–400.0)
RBC: 5.19 Mil/uL (ref 4.22–5.81)
RDW: 13.6 % (ref 11.5–15.5)
WBC: 3.6 10*3/uL — AB (ref 4.0–10.5)

## 2017-12-19 LAB — URINALYSIS, ROUTINE W REFLEX MICROSCOPIC
Bilirubin Urine: NEGATIVE
Hgb urine dipstick: NEGATIVE
Ketones, ur: NEGATIVE
Leukocytes, UA: NEGATIVE
Nitrite: NEGATIVE
PH: 6 (ref 5.0–8.0)
RBC / HPF: NONE SEEN (ref 0–?)
UROBILINOGEN UA: 0.2 (ref 0.0–1.0)
Urine Glucose: NEGATIVE

## 2017-12-19 LAB — TSH: TSH: 1.62 u[IU]/mL (ref 0.35–4.50)

## 2017-12-19 LAB — AMYLASE: Amylase: 66 U/L (ref 27–131)

## 2017-12-19 MED ORDER — HYOSCYAMINE SULFATE 0.125 MG SL SUBL: 0.1250 mg | SUBLINGUAL_TABLET | SUBLINGUAL | 0 refills | Status: AC | PRN

## 2017-12-19 NOTE — Patient Instructions (Addendum)
Abdominal Pain, Adult Abdominal pain can be caused by many things. Often, abdominal pain is not serious and it gets better with no treatment or by being treated at home. However, sometimes abdominal pain is serious. Your health care provider will do a medical history and a physical exam to try to determine the cause of your abdominal pain. Follow these instructions at home:  Take over-the-counter and prescription medicines only as told by your health care provider. Do not take a laxative unless told by your health care provider.  Drink enough fluid to keep your urine clear or pale yellow.  Watch your condition for any changes.  Keep all follow-up visits as told by your health care provider. This is important. Contact a health care provider if:  Your abdominal pain changes or gets worse.  You are not hungry or you lose weight without trying.  You are constipated or have diarrhea for more than 2-3 days.  You have pain when you urinate or have a bowel movement.  Your abdominal pain wakes you up at night.  Your pain gets worse with meals, after eating, or with certain foods.  You are throwing up and cannot keep anything down.  You have a fever. Get help right away if:  Your pain does not go away as soon as your health care provider told you to expect.  You cannot stop throwing up.  Your pain is only in areas of the abdomen, such as the right side or the left lower portion of the abdomen.  You have bloody or black stools, or stools that look like tar.  You have severe pain, cramping, or bloating in your abdomen.  You have signs of dehydration, such as: ? Dark urine, very little urine, or no urine. ? Cracked lips. ? Dry mouth. ? Sunken eyes. ? Sleepiness. ? Weakness. This information is not intended to replace advice given to you by your health care provider. Make sure you discuss any questions you have with your health care provider. Document Released: 07/11/2005 Document  Revised: 04/20/2016 Document Reviewed: 03/14/2016 Elsevier Interactive Patient Education  2018 [employer] Irritable Bowel Syndrome, Adult Irritable bowel syndrome (IBS) is not one specific disease. It is a group of symptoms that affects the organs responsible for digestion (gastrointestinal or GI tract). To regulate how your GI tract works, your body sends signals back and forth between your intestines and your brain. If you have IBS, there may be a problem with these signals. As a result, your GI tract does not function normally. Your intestines may become more sensitive and overreact to certain things. This is especially true when you eat certain foods or when you are under stress. There are four types of IBS. These may be determined based on the consistency of your stool:  IBS with diarrhea.  IBS with constipation.  Mixed IBS.  Unsubtyped IBS.  It is important to know which type of IBS you have. Some treatments are more likely to be helpful for certain types of IBS. What are the causes? The exact cause of IBS is not known. What increases the risk? You may have a higher risk of IBS if:  You are a woman.  You are younger than 26 years old.  You have a family history of IBS.  You have mental health problems.  You have had bacterial infection of your GI tract.  What are the signs or symptoms? Symptoms of IBS vary from person to person. The main symptom is abdominal pain  or discomfort. Additional symptoms usually include one or more of the following:  Diarrhea, constipation, or both.  Abdominal swelling or bloating.  Feeling full or sick after eating a small or regular-size meal.  Frequent gas.  Mucus in the stool.  A feeling of having more stool left after a bowel movement.  Symptoms tend to come and go. They may be associated with stress, psychiatric conditions, or nothing at all. How is this diagnosed? There is no specific test to diagnose IBS. Your health care  provider will make a diagnosis based on a physical exam, medical history, and your symptoms. You may have other tests to rule out other conditions that may be causing your symptoms. These may include:  Blood tests.  X-rays.  CT scan.  Endoscopy and colonoscopy. This is a test in which your GI tract is viewed with a long, thin, flexible tube.  How is this treated? There is no cure for IBS, but treatment can help relieve symptoms. IBS treatment often includes:  Changes to your diet, such as: ? Eating more fiber. ? Avoiding foods that cause symptoms. ? Drinking more water. ? Eating regular, medium-sized portioned meals.  Medicines. These may include: ? Fiber supplements if you have constipation. ? Medicine to control diarrhea (antidiarrheal medicines). ? Medicine to help control muscle spasms in your GI tract (antispasmodic medicines). ? Medicines to help with any mental health issues, such as antidepressants or tranquilizers.  Therapy. ? Talk therapy may help with anxiety, depression, or other mental health issues that can make IBS symptoms worse.  Stress reduction. ? Managing your stress can help keep symptoms under control.  Follow these instructions at home:  Take medicines only as directed by your health care provider.  Eat a healthy diet. ? Avoid foods and drinks with added sugar. ? Include more whole grains, fruits, and vegetables gradually into your diet. This may be especially helpful if you have IBS with constipation. ? Avoid any foods and drinks that make your symptoms worse. These may include dairy products and caffeinated or carbonated drinks. ? Do not eat large meals. ? Drink enough fluid to keep your urine clear or pale yellow.  Exercise regularly. Ask your health care provider for recommendations of good activities for you.  Keep all follow-up visits as directed by your health care provider. This is important. Contact a health care provider if:  You have  constant pain.  You have trouble or pain with swallowing.  You have worsening diarrhea. Get help right away if:  You have severe and worsening abdominal pain.  You have diarrhea and: ? You have a rash, stiff neck, or severe headache. ? You are irritable, sleepy, or difficult to awaken. ? You are weak, dizzy, or extremely thirsty.  You have bright red blood in your stool or you have black tarry stools.  You have unusual abdominal swelling that is painful.  You vomit continuously.  You vomit blood (hematemesis).  You have both abdominal pain and a fever. This information is not intended to replace advice given to you by your health care provider. Make sure you discuss any questions you have with your health care provider. Document Released: 10/01/2005 Document Revised: 03/02/2016 Document Reviewed: 06/18/2014 Elsevier Interactive Patient Education  2018 [employer].

## 2017-12-19 NOTE — Progress Notes (Signed)
Subjective:  Patient ID: Corey Hall, male    DOB: Nov 13, 1990  Age: 27 y.o. MRN: [id number]  CC: Abdominal Pain (ongoing for years, comes & goes. Seems like it is getting worse each year.)   HPI Corey Hall presents for the last 2 years patient has had intermittent crampy abdominal pain that seems to be localized across his lower abdomen.  It is relieved when he passes gas or has a bowel movement.  His stooling pattern has been unaffected.  Stools are formed and without blood.  He has a history of reflux but that is resolved since he began eating healthy with increasing the fruits and vegetables in his diet.  He consumes lean meats.  He does not smoke tobacco.  He rarely drinks alcohol.  He exercises almost daily.  His weight loss has been intentional.  He has no family history of colon disease.  He has no history of surgeries on his belly.  He is a Scientist, product/process development sports medicine.  When he graduates he plans to enter [employer] to continue his studies.    Outpatient Medications Prior to Visit  Medication Sig Dispense Refill  . albuterol (PROVENTIL HFA;VENTOLIN HFA) 108 (90 Base) MCG/ACT inhaler Inhale 1-2 puffs into the lungs every 6 (six) hours as needed for wheezing or shortness of breath. 1 Inhaler 5  . albuterol (PROVENTIL) (2.5 MG/3ML) 0.083% nebulizer solution Take 3 mLs (2.5 mg total) by nebulization every 6 (six) hours as needed for wheezing or shortness of breath. 75 mL 5  . doxycycline (VIBRA-TABS) 100 MG tablet Take 1 tablet (100 mg total) by mouth 2 (two) times daily. 14 tablet 0  . fexofenadine (ALLEGRA) 180 MG tablet Take 180 mg by mouth daily.    . fluticasone (VERAMYST) 27.5 MCG/SPRAY nasal spray Place 2 sprays into the nose daily.    . metroNIDAZOLE (METROCREAM) 0.75 % cream Apply topically 2 (two) times daily. 45 g 0   No facility-administered medications prior to visit.     ROS Review of Systems  Constitutional: Negative for diaphoresis, fatigue, fever  and unexpected weight change.  HENT: Negative.   Eyes: Negative.   Respiratory: Negative.   Cardiovascular: Negative.   Gastrointestinal: Positive for abdominal pain. Negative for abdominal distention, anal bleeding, blood in stool, constipation, diarrhea, nausea, rectal pain and vomiting.  Endocrine: Negative for cold intolerance and heat intolerance.  Genitourinary: Negative for difficulty urinating, discharge, frequency, hematuria and testicular pain.  Musculoskeletal: Negative for arthralgias and myalgias.  Skin: Negative.  Negative for rash.  Allergic/Immunologic: Negative for immunocompromised state.  Neurological: Negative for weakness and headaches.  Hematological: Does not bruise/bleed easily.  Psychiatric/Behavioral: Negative.     Objective:  BP 110/78 (BP Location: Right Arm, Patient Position: Sitting, Cuff Size: Normal)   Pulse 62   Temp 97.8 F (36.6 C) (Oral)   Ht 6' (1.829 m)   Wt 178 lb (80.7 kg)   SpO2 99%   BMI 24.14 kg/m   BP Readings from Last 3 Encounters:  12/19/17 110/78  05/30/17 124/82  04/30/17 120/80    Wt Readings from Last 3 Encounters:  12/19/17 178 lb (80.7 kg)  05/30/17 203 lb (92.1 kg)  04/30/17 207 lb (93.9 kg)    Physical Exam  Constitutional: He is oriented to person, place, and time. He appears well-developed and well-nourished. No distress.  HENT:  Head: Normocephalic and atraumatic.  Right Ear: External ear normal.  Left Ear: External ear normal.  Mouth/Throat: Oropharynx is clear  and moist. No oropharyngeal exudate.  Eyes: Conjunctivae are normal. Pupils are equal, round, and reactive to light. Right eye exhibits no discharge. Left eye exhibits no discharge. No scleral icterus.  Neck: Neck supple. No JVD present. No tracheal deviation present. No thyromegaly present.  Cardiovascular: Normal rate, regular rhythm and normal heart sounds.  Pulmonary/Chest: Effort normal and breath sounds normal. No stridor.  Abdominal: Soft. Bowel  sounds are normal. He exhibits no distension. There is no tenderness. There is no rebound and no guarding. Hernia confirmed negative in the right inguinal area and confirmed negative in the left inguinal area.  Genitourinary: Penis normal. Right testis shows no mass, no swelling and no tenderness. Right testis is descended. Left testis shows no mass, no swelling and no tenderness. Left testis is descended. Circumcised. No phimosis, paraphimosis, hypospadias, penile erythema or penile tenderness. No discharge found.  Lymphadenopathy:    He has no cervical adenopathy.       Right: No inguinal adenopathy present.       Left: No inguinal adenopathy present.  Neurological: He is alert and oriented to person, place, and time.  Skin: Skin is warm and dry. No rash noted. He is not diaphoretic. No erythema.  Psychiatric: He has a normal mood and affect. His behavior is normal.    Lab Results  Component Value Date   WBC 12.1 (H) 07/24/2016   HGB 15.1 07/24/2016   HCT 46.2 07/24/2016   PLT 290.0 07/24/2016   GLUCOSE 131 (H) 07/24/2016   CHOL 200 07/24/2016   TRIG 129.0 07/24/2016   HDL 44.00 07/24/2016   LDLCALC 131 (H) 07/24/2016   ALT 42 07/24/2016   AST 23 07/24/2016   NA 139 07/24/2016   K 3.6 07/24/2016   CL 103 07/24/2016   CREATININE 1.44 07/24/2016   BUN 17 07/24/2016   CO2 29 07/24/2016    No results found.  Assessment & Plan:   Corey Hall was seen today for abdominal pain.  Diagnoses and all orders for this visit:  Generalized abdominal pain -     CBC -     Comprehensive metabolic panel -     Amylase -     Urinalysis, Routine w reflex microscopic -     TSH  Irritable bowel syndrome, unspecified type -     hyoscyamine (LEVSIN SL) 0.125 MG SL tablet; Place 1 tablet (0.125 mg total) under the tongue every 4 (four) hours as needed.   I have discontinued Corey PoetMatthew E. Corey Hall fluticasone, fexofenadine, albuterol, albuterol, metroNIDAZOLE, and doxycycline. I am also having  him start on hyoscyamine.  Meds ordered this encounter  Medications  . hyoscyamine (LEVSIN SL) 0.125 MG SL tablet    Sig: Place 1 tablet (0.125 mg total) under the tongue every 4 (four) hours as needed.    Dispense:  30 tablet    Refill:  0   Blood work is pending and patient will will try Levsin.  He will follow-up in 2 weeks.  He has about an x-ray and I said that we could consider imaging studies if his symptoms do not respond to the Levsin.  Follow-up: Return in about 2 weeks (around 01/02/2018).  Mliss SaxWilliam Alfred Sam Wunschel, MD

## 2018-04-13 IMAGING — DX DG CHEST 2V
2 series · 2 of 2 positions shown · non-contrast
Comparison: Chest radiograph May 15, 2011

CLINICAL DATA: Cough, chest pain and shortness of breath for 2
days.

EXAM:
CHEST  2 VIEW

[chest pa]
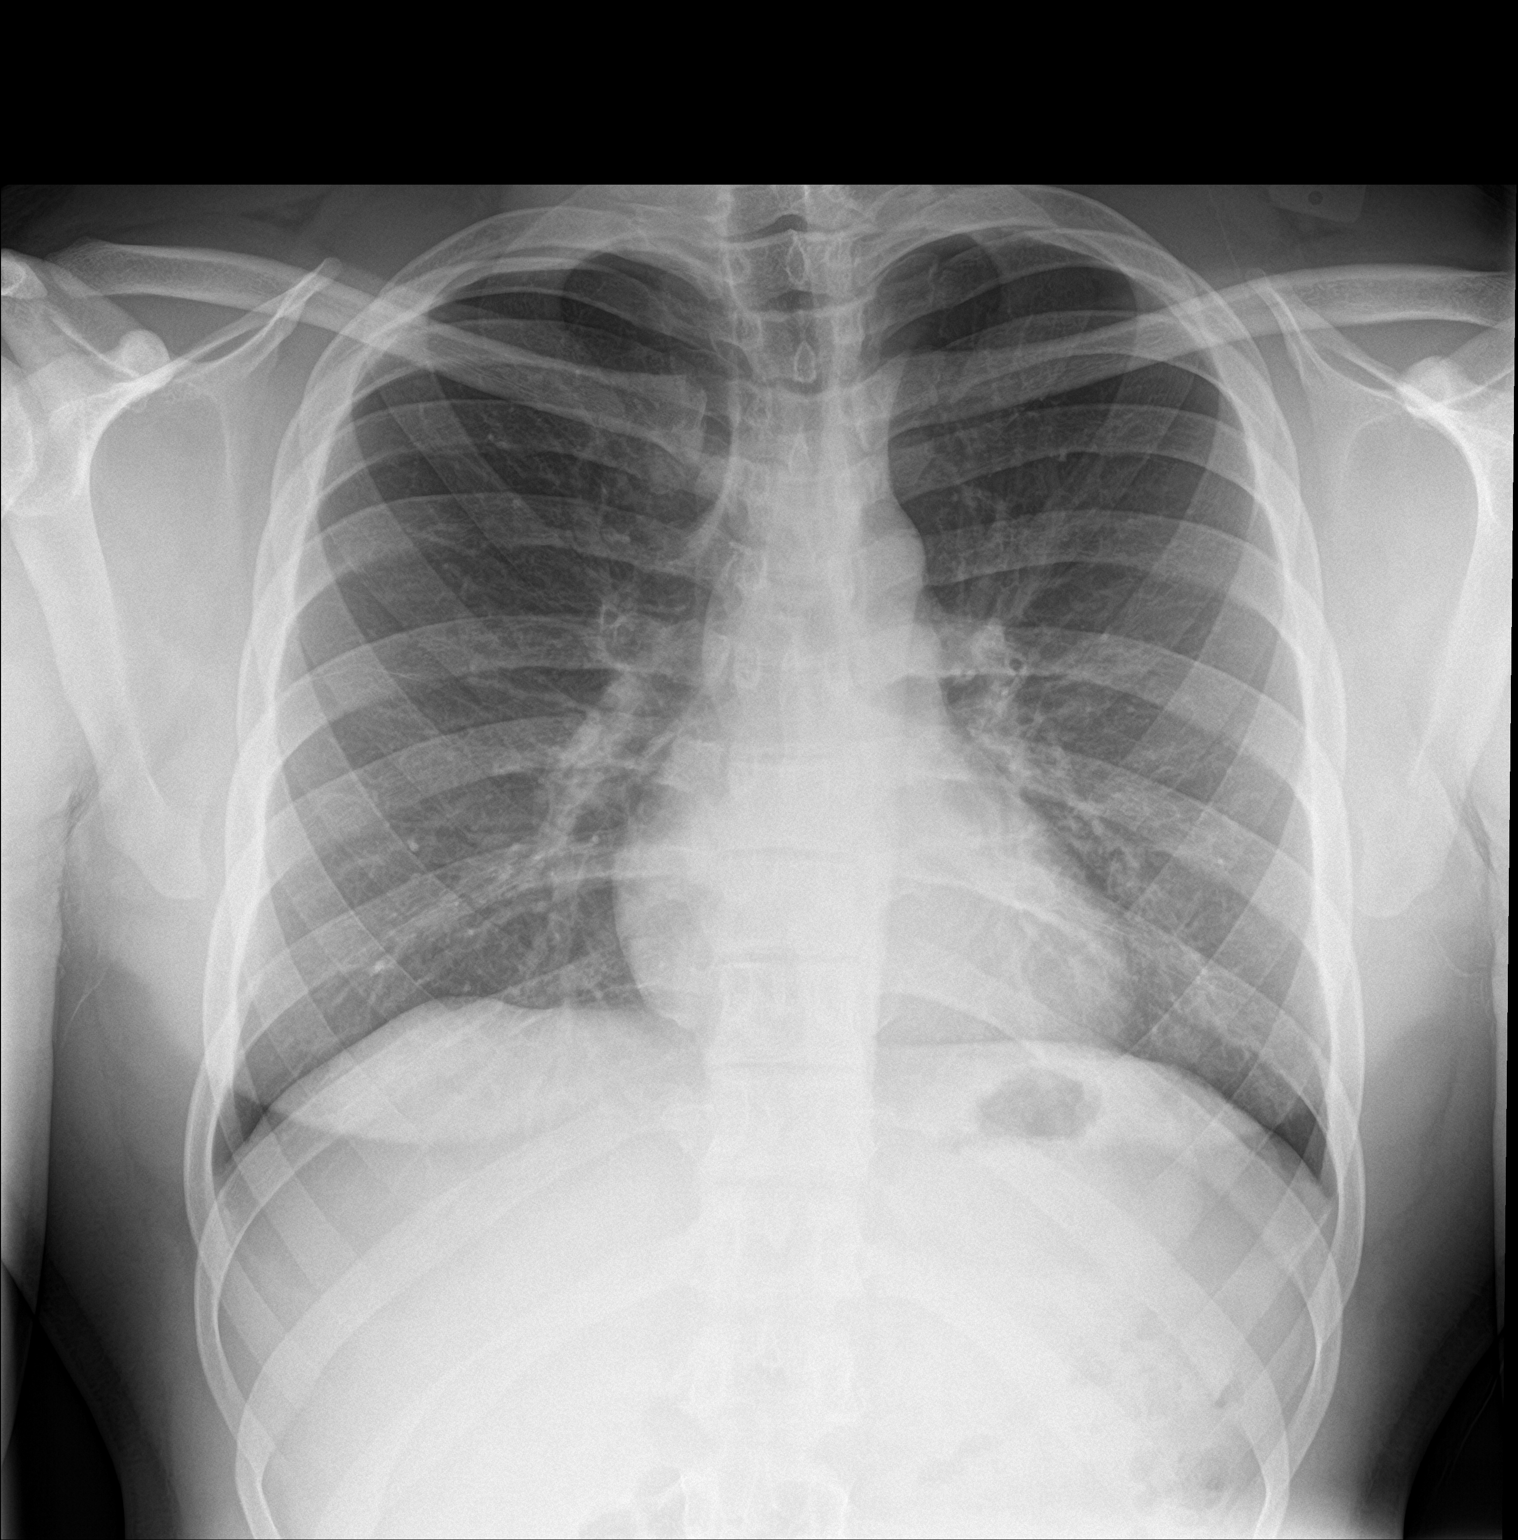

[chest lat]
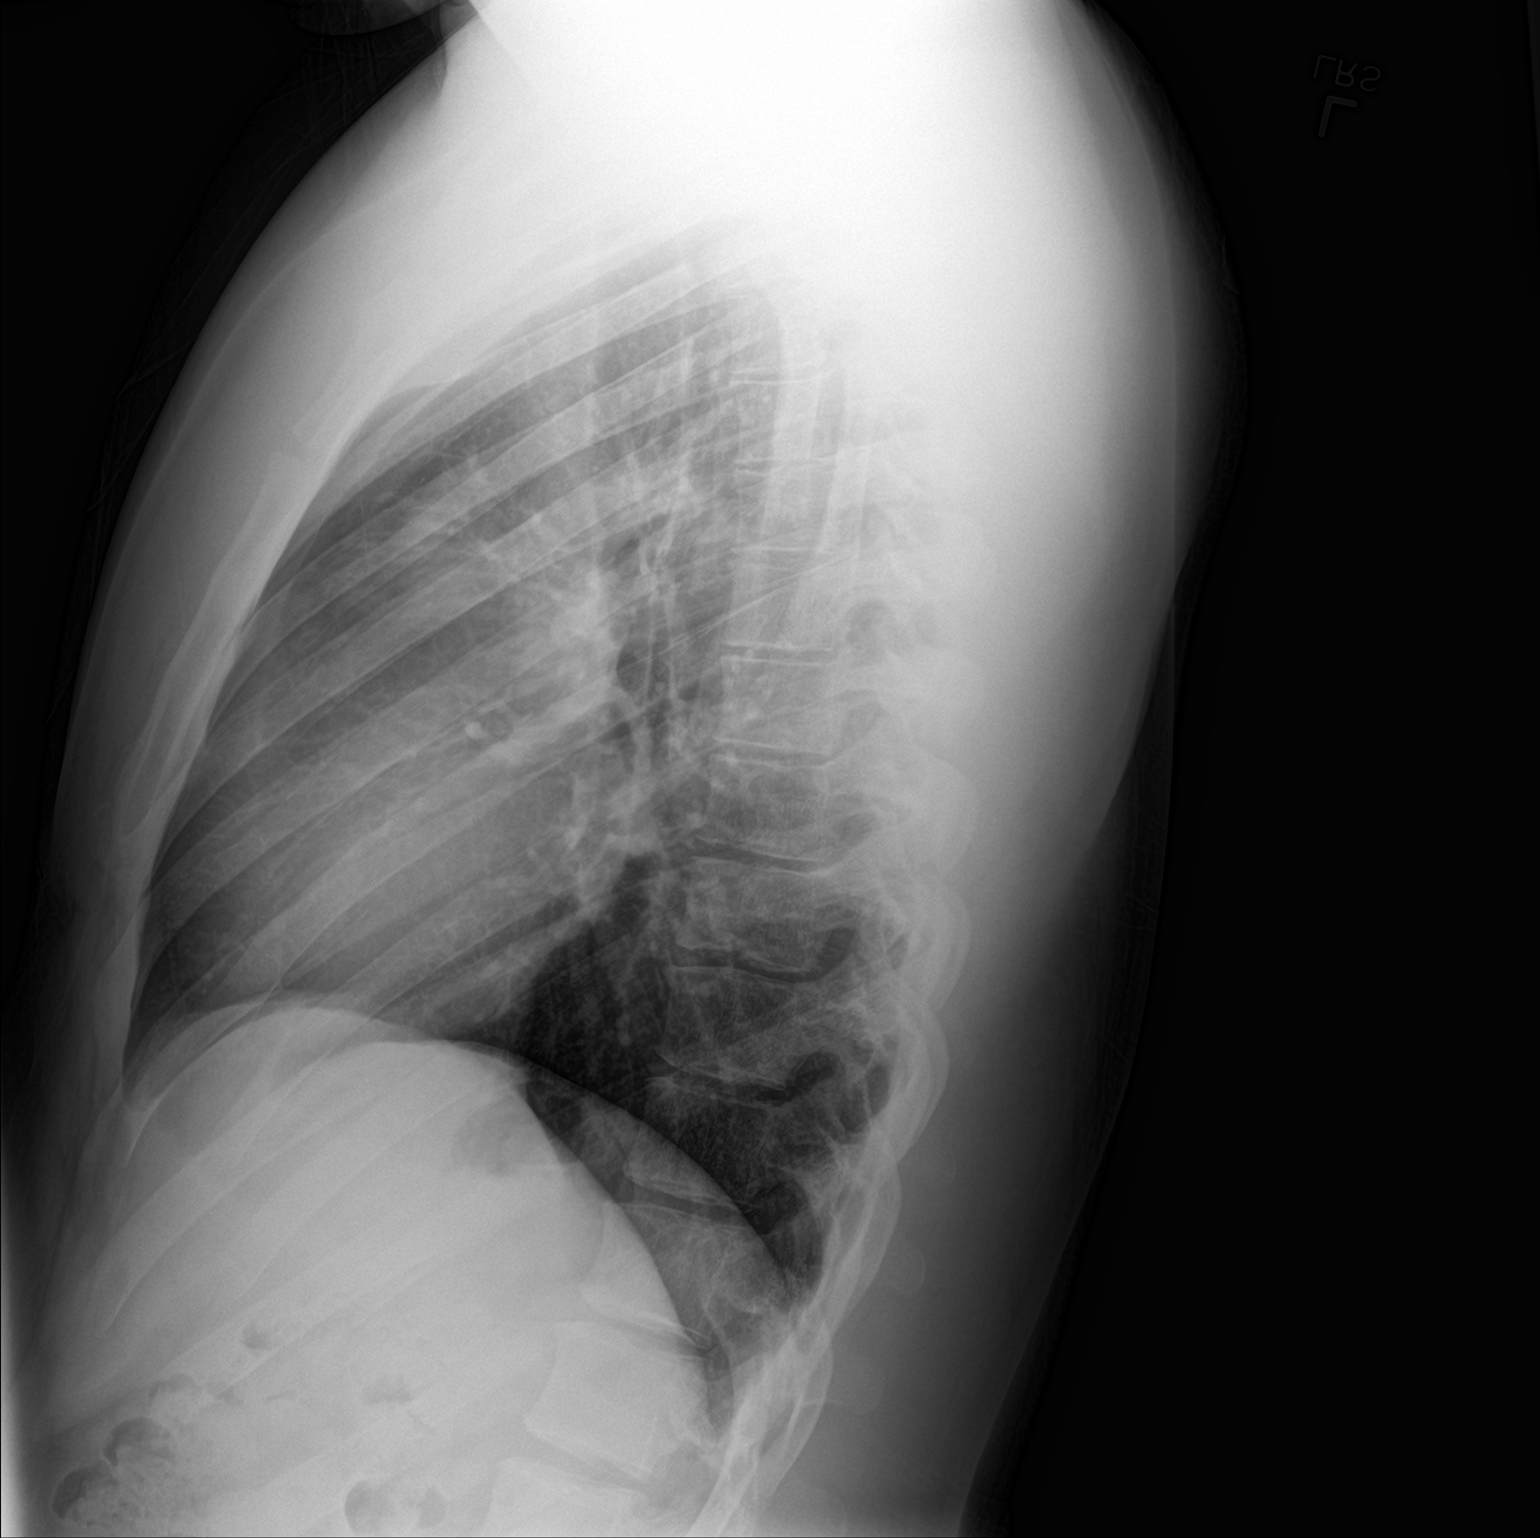

[2 of 2 positions shown; findings below may reference images not displayed]

FINDINGS: Cardiomediastinal silhouette is normal. Hazy opacities in the LEFT
lung base. Mild bronchial wall thickening. No pleural effusion. No
pneumothorax.
IMPRESSION: Bronchial wall thickening can be seen with bronchitis other reactive
airway disease. LEFT lung base atelectasis or possibly pneumonia.

## 2018-05-09 ENCOUNTER — Encounter: Payer: Self-pay | Admitting: Family Medicine

## 2018-05-09 ENCOUNTER — Ambulatory Visit: Payer: BLUE CROSS/BLUE SHIELD | Admitting: Family Medicine

## 2018-05-09 VITALS — BP 118/80 | HR 70 | Ht 72.0 in | Wt 183.2 lb

## 2018-05-09 DIAGNOSIS — K589 Irritable bowel syndrome without diarrhea: Secondary | ICD-10-CM | POA: Diagnosis not present

## 2018-05-09 DIAGNOSIS — K529 Noninfective gastroenteritis and colitis, unspecified: Secondary | ICD-10-CM | POA: Diagnosis not present

## 2018-05-09 MED ORDER — ONDANSETRON HCL 4 MG PO TABS: 4.0000 mg | ORAL_TABLET | Freq: Three times a day (TID) | ORAL | 1 refills | Status: AC | PRN

## 2018-05-09 NOTE — Progress Notes (Signed)
Subjective:  Patient ID: Corey Hall, male    DOB: 02/12/1991  Age: 27 y.o. MRN: [id number]  CC: No chief complaint on file.   HPI Corey Hall presents for evaluation of 2 episodes of nausea and vomiting with yellow emesis today.  He did eat at a cookout last night.  He did have a normal bowel movement without melena blood or pus.  He does have some generalized abdominal cramping across his lower abdomen.  He has been hydrating and drinking ginger tea.  There is been no fever but he does admit to some malaise.  With regards to his history of irritable bowel syndrome, he tells me that the Levsin/SL has been helpful.  There is been no weight loss bloody stool or melena.  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  Outpatient Medications Prior to Visit  Medication Sig Dispense Refill  . hyoscyamine (LEVSIN SL) 0.125 MG SL tablet Place 1 tablet (0.125 mg total) under the tongue every 4 (four) hours as needed. 30 tablet 0   No facility-administered medications prior to visit.     ROS Review of Systems  Constitutional: Negative for chills, fever and unexpected weight change.  HENT: Negative.   Eyes: Negative.   Respiratory: Negative.   Cardiovascular: Negative.   Gastrointestinal: Positive for abdominal pain, nausea and vomiting. Negative for anal bleeding, blood in stool, constipation, diarrhea and rectal pain.  Genitourinary: Negative.   Musculoskeletal: Negative for arthralgias and myalgias.  Skin: Negative for pallor and rash.  Neurological: Negative.   Hematological: Does not bruise/bleed easily.  Psychiatric/Behavioral: Negative.     Objective:  BP 118/80   Pulse 70   Ht 6' (1.829 m)   Wt 183 lb 4 oz (83.1 kg)   SpO2 99%   BMI 24.85 kg/m   BP Readings from Last 3 Encounters:  05/09/18 118/80  12/19/17 110/78  05/30/17 124/82    Wt Readings from Last 3 Encounters:  05/09/18 183 lb 4 oz (83.1 kg)  12/19/17 178 lb (80.7 kg)  05/30/17  203 lb (92.1 kg)    Physical Exam  Constitutional: He is oriented to person, place, and time. He appears well-developed and well-nourished. No distress.  HENT:  Head: Normocephalic and atraumatic.  Right Ear: External ear normal.  Left Ear: External ear normal.  Mouth/Throat: Oropharynx is clear and moist. No oropharyngeal exudate.  Eyes: Pupils are equal, round, and reactive to light. Conjunctivae and EOM are normal. Right eye exhibits no discharge. Left eye exhibits no discharge. No scleral icterus.  Neck: Neck supple. No JVD present. No tracheal deviation present. No thyromegaly present.  Cardiovascular: Normal rate, regular rhythm and normal heart sounds.  Pulmonary/Chest: Effort normal and breath sounds normal.  Abdominal: Soft. Bowel sounds are normal. He exhibits no distension and no mass. There is no tenderness. There is no guarding.  Lymphadenopathy:    He has no cervical adenopathy.  Neurological: He is alert and oriented to person, place, and time.  Skin: Skin is warm and dry. No rash noted. He is not diaphoretic. No erythema.  Psychiatric: He has a normal mood and affect. His behavior is normal.    Lab Results  Component Value Date   WBC 3.6 (L) 12/19/2017   HGB 14.9 12/19/2017   HCT 44.8 12/19/2017   PLT 265.0 12/19/2017   GLUCOSE 90 12/19/2017   CHOL 200 07/24/2016   TRIG 129.0 07/24/2016   HDL 44.00 07/24/2016   LDLCALC 131 (  H) 07/24/2016   ALT 24 12/19/2017   AST 22 12/19/2017   NA 138 12/19/2017   K 4.9 12/19/2017   CL 102 12/19/2017   CREATININE 1.42 12/19/2017   BUN 24 (H) 12/19/2017   CO2 31 12/19/2017   TSH 1.62 12/19/2017    No results found.  Assessment & Plan:   Diagnoses and all orders for this visit:  Gastroenteritis  Irritable bowel syndrome, unspecified type  Other orders -     ondansetron (ZOFRAN) 4 MG tablet; Take 1 tablet (4 mg total) by mouth every 8 (eight) hours as needed for nausea or vomiting.   I am having Corey Hall start on ondansetron. I am also having him maintain his hyoscyamine.  Meds ordered this encounter  Medications  . ondansetron (ZOFRAN) 4 MG tablet    Sig: Take 1 tablet (4 mg total) by mouth every 8 (eight) hours as needed for nausea or vomiting.    Dispense:  20 tablet    Refill:  1     Follow-up: Return in about 3 days (around 05/12/2018), or if symptoms worsen or fail to improve.  Mliss Sax, MD

## 2018-05-09 NOTE — Patient Instructions (Signed)
Irritable Bowel Syndrome, Adult Irritable bowel syndrome (IBS) is not one specific disease. It is a group of symptoms that affects the organs responsible for digestion (gastrointestinal or GI tract). To regulate how your GI tract works, your body sends signals back and forth between your intestines and your brain. If you have IBS, there may be a problem with these signals. As a result, your GI tract does not function normally. Your intestines may become more sensitive and overreact to certain things. This is especially true when you eat certain foods or when you are under stress. There are four types of IBS. These may be determined based on the consistency of your stool:  IBS with diarrhea.  IBS with constipation.  Mixed IBS.  Unsubtyped IBS.  It is important to know which type of IBS you have. Some treatments are more likely to be helpful for certain types of IBS. What are the causes? The exact cause of IBS is not known. What increases the risk? You may have a higher risk of IBS if:  You are a woman.  You are younger than 26 years old.  You have a family history of IBS.  You have mental health problems.  You have had bacterial infection of your GI tract.  What are the signs or symptoms? Symptoms of IBS vary from person to person. The main symptom is abdominal pain or discomfort. Additional symptoms usually include one or more of the following:  Diarrhea, constipation, or both.  Abdominal swelling or bloating.  Feeling full or sick after eating a small or regular-size meal.  Frequent gas.  Mucus in the stool.  A feeling of having more stool left after a bowel movement.  Symptoms tend to come and go. They may be associated with stress, psychiatric conditions, or nothing at all. How is this diagnosed? There is no specific test to diagnose IBS. Your health care provider will make a diagnosis based on a physical exam, medical history, and your symptoms. You may have other  tests to rule out other conditions that may be causing your symptoms. These may include:  Blood tests.  X-rays.  CT scan.  Endoscopy and colonoscopy. This is a test in which your GI tract is viewed with a long, thin, flexible tube.  How is this treated? There is no cure for IBS, but treatment can help relieve symptoms. IBS treatment often includes:  Changes to your diet, such as: ? Eating more fiber. ? Avoiding foods that cause symptoms. ? Drinking more water. ? Eating regular, medium-sized portioned meals.  Medicines. These may include: ? Fiber supplements if you have constipation. ? Medicine to control diarrhea (antidiarrheal medicines). ? Medicine to help control muscle spasms in your GI tract (antispasmodic medicines). ? Medicines to help with any mental health issues, such as antidepressants or tranquilizers.  Therapy. ? Talk therapy may help with anxiety, depression, or other mental health issues that can make IBS symptoms worse.  Stress reduction. ? Managing your stress can help keep symptoms under control.  Follow these instructions at home:  Take medicines only as directed by your health care provider.  Eat a healthy diet. ? Avoid foods and drinks with added sugar. ? Include more whole grains, fruits, and vegetables gradually into your diet. This may be especially helpful if you have IBS with constipation. ? Avoid any foods and drinks that make your symptoms worse. These may include dairy products and caffeinated or carbonated drinks. ? Do not eat large meals. ? Drink enough   fluid to keep your urine clear or pale yellow.  Exercise regularly. Ask your health care provider for recommendations of good activities for you.  Keep all follow-up visits as directed by your health care provider. This is important. Contact a health care provider if:  You have constant pain.  You have trouble or pain with swallowing.  You have worsening diarrhea. Get help right away  if:  You have severe and worsening abdominal pain.  You have diarrhea and: ? You have a rash, stiff neck, or severe headache. ? You are irritable, sleepy, or difficult to awaken. ? You are weak, dizzy, or extremely thirsty.  You have bright red blood in your stool or you have black tarry stools.  You have unusual abdominal swelling that is painful.  You vomit continuously.  You vomit blood (hematemesis).  You have both abdominal pain and a fever. This information is not intended to replace advice given to you by your health care provider. Make sure you discuss any questions you have with your health care provider. Document Released: 10/01/2005 Document Revised: 03/02/2016 Document Reviewed: 06/18/2014 Elsevier Interactive Patient Education  2018 [employer]  Viral Gastroenteritis, Adult Viral gastroenteritis is also known as the stomach flu. This condition is caused by various viruses. These viruses can be passed from person to person very easily (are very contagious). This condition may affect your stomach, small intestine, and large intestine. It can cause sudden watery diarrhea, fever, and vomiting. Diarrhea and vomiting can make you feel weak and cause you to become dehydrated. You may not be able to keep fluids down. Dehydration can make you tired and thirsty, cause you to have a dry mouth, and decrease how often you urinate. Older adults and people with other diseases or a weak immune system are at higher risk for dehydration. It is important to replace the fluids that you lose from diarrhea and vomiting. If you become severely dehydrated, you may need to get fluids through an IV tube. What are the causes? Gastroenteritis is caused by various viruses, including rotavirus and norovirus. Norovirus is the most common cause in adults. You can get sick by eating food, drinking water, or touching a surface contaminated with one of these viruses. You can also get sick from sharing  utensils or other personal items with an infected person. What increases the risk? This condition is more likely to develop in people:  Who have a weak defense system (immune system).  Who live with one or more children who are younger than 26 years old.  Who live in a nursing home.  Who go on cruise ships.  What are the signs or symptoms? Symptoms of this condition start suddenly 1-2 days after exposure to a virus. Symptoms may last a few days or as long as a week. The most common symptoms are watery diarrhea and vomiting. Other symptoms include:  Fever.  Headache.  Fatigue.  Pain in the abdomen.  Chills.  Weakness.  Nausea.  Muscle aches.  Loss of appetite.  How is this diagnosed? This condition is diagnosed with a medical history and physical exam. You may also have a stool test to check for viruses or other infections. How is this treated? This condition typically goes away on its own. The focus of treatment is to restore lost fluids (rehydration). Your health care provider may recommend that you take an oral rehydration solution (ORS) to replace important salts and minerals (electrolytes) in your body. Severe cases of this condition may require  giving fluids through an IV tube. Treatment may also include medicine to help with your symptoms. Follow these instructions at home: Follow instructions from your health care provider about how to care for yourself at home. Eating and drinking Follow these recommendations as told by your health care provider:  Take an ORS. This is a drink that is sold at pharmacies and retail stores.  Drink clear fluids in small amounts as you are able. Clear fluids include water, ice chips, diluted fruit juice, and low-calorie sports drinks.  Eat bland, easy-to-digest foods in small amounts as you are able. These foods include bananas, applesauce, rice, lean meats, toast, and crackers.  Avoid fluids that contain a lot of sugar or caffeine,  such as energy drinks, sports drinks, and soda.  Avoid alcohol.  Avoid spicy or fatty foods.  General instructions   Drink enough fluid to keep your urine clear or pale yellow.  Wash your hands often. If soap and water are not available, use hand sanitizer.  Make sure that all people in your household wash their hands well and often.  Take over-the-counter and prescription medicines only as told by your health care provider.  Rest at home while you recover.  Watch your condition for any changes.  Take a warm bath to relieve any burning or pain from frequent diarrhea episodes.  Keep all follow-up visits as told by your health care provider. This is important. Contact a health care provider if:  You cannot keep fluids down.  Your symptoms get worse.  You have new symptoms.  You feel light-headed or dizzy.  You have muscle cramps. Get help right away if:  You have chest pain.  You feel extremely weak or you faint.  You see blood in your vomit.  Your vomit looks like coffee grounds.  You have bloody or black stools or stools that look like tar.  You have a severe headache, a stiff neck, or both.  You have a rash.  You have severe pain, cramping, or bloating in your abdomen.  You have trouble breathing or you are breathing very quickly.  Your heart is beating very quickly.  Your skin feels cold and clammy.  You feel confused.  You have pain when you urinate.  You have signs of dehydration, such as: ? Dark urine, very little urine, or no urine. ? Cracked lips. ? Dry mouth. ? Sunken eyes. ? Sleepiness. ? Weakness. This information is not intended to replace advice given to you by your health care provider. Make sure you discuss any questions you have with your health care provider. Document Released: 10/01/2005 Document Revised: 03/14/2016 Document Reviewed: 06/07/2015 Elsevier Interactive Patient Education  [employer].

## 2018-07-18 ENCOUNTER — Ambulatory Visit: Payer: BLUE CROSS/BLUE SHIELD | Admitting: Family Medicine

## 2018-07-18 ENCOUNTER — Encounter: Payer: Self-pay | Admitting: Family Medicine

## 2018-07-18 VITALS — BP 100/70 | HR 69 | Temp 97.9°F | Ht 72.0 in | Wt 186.4 lb

## 2018-07-18 DIAGNOSIS — L738 Other specified follicular disorders: Secondary | ICD-10-CM | POA: Diagnosis not present

## 2018-07-18 MED ORDER — CLINDAMYCIN PHOS-BENZOYL PEROX 1-5 % EX GEL: Freq: Two times a day (BID) | CUTANEOUS | 0 refills | Status: AC

## 2018-07-18 NOTE — Patient Instructions (Signed)
Apply topicial gel twice per day Avoid shaving hair too close to skin Follow up if not improving.

## 2018-07-18 NOTE — Progress Notes (Signed)
Corey Hall - 27 y.o. male MRN [id number]  Date of birth: 07/16/1991  Subjective Chief Complaint  Patient presents with  . Rash    HPI Corey Hall is a 27 y.o. male here today with rash of back of scalp.  He shaved scalp to have new tattoo completed a few days ago and after haircut he developed several bumps on the back of the scalp.  These are itchy and a little painful.  He has tried rubbing alcohol, witch hazel and cortisone on this without much improvement. He denies drainage, fever or chills.   ROS:  A comprehensive ROS was completed and negative except as noted per HPI  No Known Allergies  Past Medical History:  Diagnosis Date  . Allergic rhinitis due to pollen   . Asthma in remission     Past Surgical History:  Procedure Laterality Date  . TOOTH EXTRACTION      Social History   Socioeconomic History  . Marital status: Single    Spouse name: Not on file  . Number of children: Not on file  . Years of education: Not on file  . Highest education level: Not on file  Occupational History  . Occupation: Student    Comment:  [employer]  Social Needs  . Financial resource strain: Not on file  . Food insecurity:    Worry: Not on file    Inability: Not on file  . Transportation needs:    Medical: Not on file    Non-medical: Not on file  Tobacco Use  . Smoking status: Never Smoker  . Smokeless tobacco: Never Used  Substance and Sexual Activity  . Alcohol use: Yes    Comment: intermittent  . Drug use: Yes    Comment: MJ, intermittent  . Sexual activity: Yes    Partners: Female  Lifestyle  . Physical activity:    Days per week: Not on file    Minutes per session: Not on file  . Stress: Not on file  Relationships  . Social connections:    Talks on phone: Not on file    Gets together: Not on file    Attends religious service: Not on file    Active member of club or organization: Not on file    Attends meetings of clubs or organizations: Not on  file    Relationship status: Not on file  Other Topics Concern  . Not on file  Social History Narrative   Soph at [employer]   Exercise and Sports Science Major   Enjoys working out and [employer]    Family History  Problem Relation Age of Onset  . Hypertension Father   . Hypertension Maternal Grandmother   . Diabetes Maternal Grandmother   . Heart disease Maternal Grandmother   . Stroke Maternal Grandmother   . Hypertension Paternal Grandmother   . Heart disease Paternal Grandmother   . Cancer Paternal Grandfather     Health Maintenance  Topic Date Due  . TETANUS/TDAP  08/20/2011  . INFLUENZA VACCINE  05/15/2018  . HIV Screening  Completed    ----------------------------------------------------------------------------------------------------------------------------------------------------------------------------------------------------------------- Physical Exam BP 100/70   Pulse 69   Temp 97.9 F (36.6 C)   Ht 6' (1.829 m)   Wt 186 lb 6.4 oz (84.6 kg)   SpO2 98%   BMI 25.28 kg/m   Physical Exam  Constitutional: He is oriented to person, place, and time. He appears well-nourished. No distress.  HENT:  Head: Normocephalic  and atraumatic.  Mouth/Throat: Oropharynx is clear and moist.  Neurological: He is alert and oriented to person, place, and time.  Skin:  There is a patch of pustules along posterior scalp extending from hairline to halfway up occiput.   Psychiatric: He has a normal mood and affect. His behavior is normal.    ------------------------------------------------------------------------------------------------------------------------------------------------------------------------------------------------------------------- Assessment and Plan  Pseudofolliculitis -Discussed avoiding shaving for now -Rx for benzaclin -Return if not improving.

## 2018-07-18 NOTE — Assessment & Plan Note (Signed)
-  Discussed avoiding shaving for now -Rx for benzaclin -Return if not improving.

## 2020-04-06 ENCOUNTER — Telehealth: Payer: Self-pay | Admitting: General Practice

## 2020-04-06 NOTE — Telephone Encounter (Signed)
Removed Dr. Aron as patient's PCP due to her leaving this practice and patient has not been seen by her in 3 years.  

## 2020-08-21 ENCOUNTER — Emergency Department (HOSPITAL_COMMUNITY): Payer: Self-pay

## 2020-08-21 ENCOUNTER — Emergency Department (HOSPITAL_COMMUNITY)
Admission: EM | Admit: 2020-08-21 | Discharge: 2020-08-21 | Disposition: A | Discharge status: Home / Self Care | Payer: Self-pay | Attending: Emergency Medicine | Admitting: Emergency Medicine

## 2020-08-21 ENCOUNTER — Ambulatory Visit (HOSPITAL_COMMUNITY)
Admission: EM | Admit: 2020-08-21 | Discharge: 2020-08-21 | Disposition: A | Discharge status: Nursing Home (Includes ICF) | Payer: BLUE CROSS/BLUE SHIELD | Attending: Emergency Medicine | Admitting: Emergency Medicine

## 2020-08-21 ENCOUNTER — Other Ambulatory Visit: Payer: Self-pay

## 2020-08-21 DIAGNOSIS — J45909 Unspecified asthma, uncomplicated: Secondary | ICD-10-CM | POA: Insufficient documentation

## 2020-08-21 DIAGNOSIS — R0602 Shortness of breath: Secondary | ICD-10-CM | POA: Insufficient documentation

## 2020-08-21 DIAGNOSIS — R079 Chest pain, unspecified: Secondary | ICD-10-CM

## 2020-08-21 DIAGNOSIS — R072 Precordial pain: Secondary | ICD-10-CM | POA: Insufficient documentation

## 2020-08-21 LAB — CBC WITH DIFFERENTIAL/PLATELET
Abs Immature Granulocytes: 0.03 10*3/uL (ref 0.00–0.07)
Basophils Absolute: 0 10*3/uL (ref 0.0–0.1)
Basophils Relative: 0 %
Eosinophils Absolute: 0.4 10*3/uL (ref 0.0–0.5)
Eosinophils Relative: 4 %
HCT: 45.7 % (ref 39.0–52.0)
Hemoglobin: 15.4 g/dL (ref 13.0–17.0)
Immature Granulocytes: 0 %
Lymphocytes Relative: 25 %
Lymphs Abs: 2.6 10*3/uL (ref 0.7–4.0)
MCH: 28.2 pg (ref 26.0–34.0)
MCHC: 33.7 g/dL (ref 30.0–36.0)
MCV: 83.7 fL (ref 80.0–100.0)
Monocytes Absolute: 1.4 10*3/uL — ABNORMAL HIGH (ref 0.1–1.0)
Monocytes Relative: 13 %
Neutro Abs: 6.1 10*3/uL (ref 1.7–7.7)
Neutrophils Relative %: 58 %
Platelets: 341 10*3/uL (ref 150–400)
RBC: 5.46 MIL/uL (ref 4.22–5.81)
RDW: 15 % (ref 11.5–15.5)
WBC: 10.5 10*3/uL (ref 4.0–10.5)
nRBC: 0 % (ref 0.0–0.2)

## 2020-08-21 LAB — COMPREHENSIVE METABOLIC PANEL
ALT: 69 U/L — ABNORMAL HIGH (ref 0–44)
AST: 54 U/L — ABNORMAL HIGH (ref 15–41)
Albumin: 2.9 g/dL — ABNORMAL LOW (ref 3.5–5.0)
Alkaline Phosphatase: 27 U/L — ABNORMAL LOW (ref 38–126)
Anion gap: 8 (ref 5–15)
BUN: 11 mg/dL (ref 6–20)
CO2: 24 mmol/L (ref 22–32)
Calcium: 8.6 mg/dL — ABNORMAL LOW (ref 8.9–10.3)
Chloride: 102 mmol/L (ref 98–111)
Creatinine, Ser: 1.4 mg/dL — ABNORMAL HIGH (ref 0.61–1.24)
GFR, Estimated: >60 mL/min (ref >60)
Glucose, Bld: 90 mg/dL (ref 70–99)
Potassium: 4.1 mmol/L (ref 3.5–5.1)
Sodium: 134 mmol/L — ABNORMAL LOW (ref 135–145)
Total Bilirubin: 1.1 mg/dL (ref 0.3–1.2)
Total Protein: 6.8 g/dL (ref 6.5–8.1)

## 2020-08-21 LAB — D-DIMER, QUANTITATIVE (NOT AT ARMC): D-Dimer, Quant: 1.29 ug/mL-FEU — ABNORMAL HIGH (ref 0.00–0.50)

## 2020-08-21 LAB — TROPONIN I (HIGH SENSITIVITY)
Troponin I (High Sensitivity): 10 ng/L (ref <18)
Troponin I (High Sensitivity): 8 ng/L (ref <18)

## 2020-08-21 MED ORDER — ASPIRIN 81 MG PO CHEW
CHEWABLE_TABLET | ORAL | Status: AC
  Filled 2020-08-21: qty 4

## 2020-08-21 MED ORDER — IOHEXOL 350 MG/ML SOLN
75.0000 mL | Freq: Once | INTRAVENOUS | Status: AC | PRN
  Administered 2020-08-21: 75 mL via INTRAVENOUS

## 2020-08-21 MED ORDER — ASPIRIN 81 MG PO CHEW
324.0000 mg | CHEWABLE_TABLET | Freq: Once | ORAL | Status: AC
  Administered 2020-08-21: 324 mg via ORAL

## 2020-08-21 NOTE — ED Notes (Signed)
Visitor at bedside.

## 2020-08-21 NOTE — ED Provider Notes (Signed)
[facility] EMERGENCY DEPARTMENT Provider Note   CSN: [id number] Arrival date & time: 08/21/20  1611     History Chief Complaint  Patient presents with  . Chest Pain    KORREY SCHLEICHER is a 29 y.o. male w PMHx asthma, presenting to the ED from [facility] for evaluation of chest pain. Patient reports constant waxing and waning midsternal chest pain, dull and sharp pain. It began at rest this morning and felt SOB with CP with palpitations with fast HR. No nausea or diaphoresis. No hx of the same. No weakness, cough, fever, leg swelling. No hx of DVT/PE, no recent travel/surgery, unilateral leg swelling or pain. 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 Reports taking masteron 250mg   MWF,  250mg  testosterone MWF, and trembalone "a cutting steroid" 250mg  MWF. He has been taking this >1.65yrs, though took a break between June and September. No hx of cardiac problems. No family hx of cardiac problems.    The history is provided by the patient.       Past Medical History:  Diagnosis Date  . Allergic rhinitis due to pollen   . Asthma in remission     Patient Active Problem List   Diagnosis Date Noted  . 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 Gastroenteritis 05/09/2018  . Generalized abdominal pain 12/19/2017  . Irritable bowel syndrome 12/19/2017  . Right lower quadrant abdominal pain 05/30/2017  . Screening examination for STD (sexually transmitted disease) 05/30/2017  . Axillary abscess 04/30/2017  . HLD (hyperlipidemia) 03/29/2015  . GERD (gastroesophageal reflux disease) 03/29/2015  . Screening for STD (sexually transmitted disease) 03/29/2015  . Asthma in remission   . Allergic rhinitis due to pollen     Past Surgical History:  Procedure Laterality Date  . TOOTH EXTRACTION         Family History  Problem Relation Age of Onset  . Hypertension Father   . Hypertension Maternal Grandmother   . Diabetes Maternal Grandmother   . Heart disease  Maternal Grandmother   . Stroke Maternal Grandmother   . Hypertension Paternal Grandmother   . Heart disease Paternal Grandmother   . Cancer Paternal Grandfather     Social History   Tobacco Use  . Smoking status: Never Smoker  . Smokeless tobacco: Never Used  Substance Use Topics  . Alcohol use: Yes    Comment: intermittent  . Drug use: Yes    Comment: MJ, intermittent    Home Medications Prior to Admission medications   Medication Sig Start Date End Date Taking? Authorizing Provider  Ascorbic Acid (VITAMIN C) 1000 MG tablet Take 1,000 mg by mouth daily.   Yes [provider]  cholecalciferol (VITAMIN D3) 25 MCG (1000 UNIT) tablet Take 1,000 Units by mouth daily.   Yes [provider]  Multiple Vitamin (MULTIVITAMIN WITH MINERALS) TABS tablet Take 1 tablet by mouth daily.   Yes [provider]  clindamycin-benzoyl peroxide (BENZACLIN) gel Apply topically 2 (two) times daily. Patient not taking: Reported on 08/21/2020 07/18/18   03/31/2015, DO  hyoscyamine (LEVSIN SL) 0.125 MG SL tablet Place 1 tablet (0.125 mg total) under the tongue every 4 (four) hours as needed. Patient not taking: Reported on 08/21/2020 12/19/17   Everrett Coombe, MD  ondansetron (ZOFRAN) 4 MG tablet Take 1 tablet (4 mg total) by mouth every 8 (eight) hours as needed for nausea or vomiting. Patient not taking: Reported on 08/21/2020 05/09/18   Mliss Sax, MD    Allergies  Patient has no known allergies.  Review of Systems   Review of Systems  Constitutional: Negative for diaphoresis and fever.  Respiratory: Positive for shortness of breath. Negative for cough.   Cardiovascular: Positive for chest pain. Negative for palpitations and leg swelling.  Gastrointestinal: Negative for abdominal pain.  Neurological: Negative for weakness.  All other systems reviewed and are negative.   Physical Exam Updated Vital Signs BP 130/85   Pulse 96   Temp 98.6 F (37  C) (Oral)   Resp 16   Ht 6' (1.829 m)   Wt 97.5 kg   SpO2 97%   BMI 29.16 kg/m   Physical Exam Vitals and nursing note reviewed.  Constitutional:      Appearance: He is well-developed.  HENT:     Head: Normocephalic and atraumatic.  Eyes:     Conjunctiva/sclera: Conjunctivae normal.  Cardiovascular:     Rate and Rhythm: Normal rate and regular rhythm.     Heart sounds: Normal heart sounds.  Pulmonary:     Effort: Pulmonary effort is normal. No respiratory distress.     Breath sounds: Normal breath sounds.  Chest:     Chest wall: No tenderness.  Abdominal:     General: Bowel sounds are normal.     Palpations: Abdomen is soft.     Tenderness: There is no abdominal tenderness.  Musculoskeletal:     Right lower leg: No edema.     Left lower leg: No edema.  Skin:    General: Skin is warm.  Neurological:     Mental Status: He is alert.  Psychiatric:        Behavior: Behavior normal.     ED Results / Procedures / Treatments   Labs (all labs ordered are listed, but only abnormal results are displayed) Labs Reviewed  CBC WITH DIFFERENTIAL/PLATELET - Abnormal; Notable for the following components:      Result Value   Monocytes Absolute 1.4 (*)    All other components within normal limits  COMPREHENSIVE METABOLIC PANEL - Abnormal; Notable for the following components:   Sodium 134 (*)    Creatinine, Ser 1.40 (*)    Calcium 8.6 (*)    Albumin 2.9 (*)    AST 54 (*)    ALT 69 (*)    Alkaline Phosphatase 27 (*)    All other components within normal limits  D-DIMER, QUANTITATIVE (NOT AT [facility]) - Abnormal; Notable for the following components:   D-Dimer, Quant 1.29 (*)    All other components within normal limits  TROPONIN I (HIGH SENSITIVITY)  TROPONIN I (HIGH SENSITIVITY)    EKG EKG Interpretation  Date/Time:  Sunday August 21 2020 16:23:53 EST Ventricular Rate:  88 PR Interval:    QRS Duration: 72 QT Interval:  316 QTC Calculation: 383 R Axis:   67 Text  Interpretation: Sinus rhythm Nonspecific T abnormalities, diffuse leads No significant change since last tracing diffuse down sloping st segments Otherwise no significant change Confirmed by Melene Plan [phone]) on 08/21/2020 5:48:29 PM   Radiology DG Chest 2 View  Result Date: 08/21/2020 CLINICAL DATA:  Chest pain EXAM: CHEST - 2 VIEW COMPARISON:  07/10/2016 FINDINGS: The heart size and mediastinal contours are within normal limits. Both lungs are clear. The visualized skeletal structures are unremarkable. IMPRESSION: No active cardiopulmonary disease. Electronically Signed   By: Sharlet Salina M.D.   On: 08/21/2020 18:47   CT Angio Chest PE W/Cm &/Or Wo Cm  Result Date: 08/21/2020 CLINICAL DATA:  Midsternal  chest pain with shortness of breath EXAM: CT ANGIOGRAPHY CHEST WITH CONTRAST TECHNIQUE: Multidetector CT imaging of the chest was performed using the standard protocol during bolus administration of intravenous contrast. Multiplanar CT image reconstructions and MIPs were obtained to evaluate the vascular anatomy. CONTRAST:  100mL OMNIPAQUE IOHEXOL 350 MG/ML SOLN COMPARISON:  None. FINDINGS: Cardiovascular: There is slightly suboptimal opacification of the main pulmonary artery, however no central pulmonary embolism seen. There is limited visualization of the distal segmental and subsegmental branches. The heart is normal in size. No pericardial effusion or thickening. No evidence right heart strain. There is normal three-vessel brachiocephalic anatomy without proximal stenosis. The thoracic aorta is normal in appearance. Mediastinum/Nodes: No hilar, mediastinal, or axillary adenopathy. Thyroid gland, trachea, and esophagus demonstrate no significant findings. Lungs/Pleura: The lungs are clear. No pleural effusion or pneumothorax. No airspace consolidation. Upper Abdomen: No acute abnormalities present in the visualized portions of the upper abdomen. Musculoskeletal: No chest wall abnormality. No acute or  significant osseous findings. Review of the MIP images confirms the above findings. IMPRESSION: Suboptimal opacification of the main pulmonary artery however no central or segmental pulmonary embolism. There is poor opacification of the distal segmental and subsegmental branches. No other acute intrathoracic pathology to explain the patient's symptoms. Electronically Signed   By: Jonna Clark M.D.   On: 08/21/2020 20:22    Procedures Procedures (including critical care time)  Medications Ordered in ED Medications  iohexol (OMNIPAQUE) 350 MG/ML injection 75 mL (75 mLs Intravenous Contrast Given 08/21/20 1946)    ED Course  I have reviewed the triage vital signs and the nursing notes.  Pertinent labs & imaging results that were available during my care of the patient were reviewed by me and considered in my medical decision making (see chart for details).    MDM Rules/Calculators/A&P                          Patient with history of anabolic steroid use, recreational testosterone use, presenting with chest pain and SOB that began this morning. Patient reports symptoms waxed and waned throughout the day. No cardiac hx or hx of PE. He was sent by UC for abnl EKG. EKG today with diffuse downsloping ST segments which is new from previous, reviewed by Dr. Adela Lank. Given recreational steroid and testosterone use, cardiac workup initiated including troponin and d-dimer. Patient is pain free on evaluation, no distress. Heart and lung sounds are normal. Slightly tachycardic initially, though resolved without intervention.   Troponin x2 are neg. Unlikely ACS. D-dimer is elevated, this was followed by CTA chest which is negative. CXR is clear. Less likely acute cardiomyopathy. Care plan discussed with attending Dr. Adela Lank. Patient is appropriate for discharge with close PCP follow up and strict return precautions. Patient counseled on risks of recreational use of anabolic steroids and testosterone.  Final  Clinical Impression(s) / ED Diagnoses Final diagnoses:  Substernal chest pain  Shortness of breath    Rx / DC Orders ED Discharge Orders    None       Sabrina Arriaga, [place] N, PA-C 08/21/20 2127    Melene Plan, DO 08/22/20 1458

## 2020-08-21 NOTE — ED Notes (Signed)
EMS called for emergent transport for CP and EKG abnormalities, SOB.

## 2020-08-21 NOTE — Discharge Instructions (Addendum)
Please follow closely with your primary care provider regarding your visit today. Return the emergency department if you develop worsening chest pain, worsening shortness of breath, new or concerning symptoms.

## 2020-08-21 NOTE — ED Provider Notes (Signed)
[facility]    CSN: [id number] Arrival date & time: 08/21/20  1505      History   Chief Complaint Chief Complaint  Patient presents with  . Chest Pain  . Shortness of Breath    HPI Corey Hall is a 29 y.o. male.   Patient presents with new onset midsternal chest pain with shortness of breath 2 hours ago.  He states he has been using steroids for bodybuilding.  He denies focal weakness, dizziness, fever, cough, lower extremity edema, or other symptoms.  His medical history includes asthma, hyperlipidemia, GERD, IBS.  The history is provided by the patient and medical records.    Past Medical History:  Diagnosis Date  . Allergic rhinitis due to pollen   . Asthma in remission     Patient Active Problem List   Diagnosis Date Noted  . 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 Gastroenteritis 05/09/2018  . Generalized abdominal pain 12/19/2017  . Irritable bowel syndrome 12/19/2017  . Right lower quadrant abdominal pain 05/30/2017  . Screening examination for STD (sexually transmitted disease) 05/30/2017  . Axillary abscess 04/30/2017  . HLD (hyperlipidemia) 03/29/2015  . GERD (gastroesophageal reflux disease) 03/29/2015  . Screening for STD (sexually transmitted disease) 03/29/2015  . Asthma in remission   . Allergic rhinitis due to pollen     Past Surgical History:  Procedure Laterality Date  . TOOTH EXTRACTION         Home Medications    Prior to Admission medications   Medication Sig Start Date End Date Taking? Authorizing Provider  clindamycin-benzoyl peroxide (BENZACLIN) gel Apply topically 2 (two) times daily. 07/18/18   Everrett Coombe, DO  hyoscyamine (LEVSIN SL) 0.125 MG SL tablet Place 1 tablet (0.125 mg total) under the tongue every 4 (four) hours as needed. 12/19/17   Mliss Sax, MD  ondansetron (ZOFRAN) 4 MG tablet Take 1 tablet (4 mg total) by mouth every 8 (eight) hours as needed for nausea or vomiting. 05/09/18   Mliss Sax, MD    Family History Family History  Problem Relation Age of Onset  . Hypertension Father   . Hypertension Maternal Grandmother   . Diabetes Maternal Grandmother   . Heart disease Maternal Grandmother   . Stroke Maternal Grandmother   . Hypertension Paternal Grandmother   . Heart disease Paternal Grandmother   . Cancer Paternal Grandfather     Social History Social History   Tobacco Use  . Smoking status: Never Smoker  . Smokeless tobacco: Never Used  Substance Use Topics  . Alcohol use: Yes    Comment: intermittent  . Drug use: Yes    Comment: MJ, intermittent     Allergies   Patient has no known allergies.   Review of Systems Review of Systems  Constitutional: Negative for chills and fever.  HENT: Negative for ear pain and sore throat.   Eyes: Negative for pain and visual disturbance.  Respiratory: Positive for shortness of breath. Negative for cough.   Cardiovascular: Positive for chest pain. Negative for palpitations.  Gastrointestinal: Negative for abdominal pain and vomiting.  Genitourinary: Negative for dysuria and hematuria.  Musculoskeletal: Negative for arthralgias and back pain.  Skin: Negative for color change and rash.  Neurological: Negative for seizures and syncope.  All other systems reviewed and are negative.    Physical Exam Triage Vital Signs ED Triage Vitals  Enc Vitals Group     BP      Pulse      Resp  Temp      Temp src      SpO2      Weight      Height      Head Circumference      Peak Flow      Pain Score      Pain Loc      Pain Edu?      Excl. in GC?    No data found.  Updated Vital Signs BP (!) 157/91 (BP Location: Right Arm)   Pulse (!) 104   Temp 98.6 F (37 C) (Oral)   Resp 20   SpO2 98%   Visual Acuity Right Eye Distance:   Left Eye Distance:   Bilateral Distance:    Right Eye Near:   Left Eye Near:    Bilateral Near:     Physical Exam Vitals and nursing note reviewed.  Constitutional:       Appearance: He is well-developed.  HENT:     Head: Normocephalic and atraumatic.  Eyes:     Conjunctiva/sclera: Conjunctivae normal.  Cardiovascular:     Rate and Rhythm: Normal rate and regular rhythm.     Heart sounds: No murmur heard.   Pulmonary:     Effort: Pulmonary effort is normal. No respiratory distress.     Breath sounds: Normal breath sounds.  Abdominal:     Palpations: Abdomen is soft.     Tenderness: There is no abdominal tenderness.  Musculoskeletal:     Cervical back: Neck supple.  Skin:    General: Skin is warm and dry.  Neurological:     Mental Status: He is alert.      UC Treatments / Results  Labs (all labs ordered are listed, but only abnormal results are displayed) Labs Reviewed - No data to display  EKG   Radiology No results found.  Procedures Procedures (including critical care time)  Medications Ordered in UC Medications  aspirin chewable tablet 324 mg (324 mg Oral Given 08/21/20 1535)    Initial Impression / Assessment and Plan / UC Course  I have reviewed the triage vital signs and the nursing notes.  Pertinent labs & imaging results that were available during my care of the patient were reviewed by me and considered in my medical decision making (see chart for details).   Chest pain, SOB.  EKG shows sinus rhythm, rate 91, ST elevation in V1, V2, V3; inverted T's in lead II, 3, aVF, V4; compared to previous from 2012.  Sending to ED via EMS.     Final Clinical Impressions(s) / UC Diagnoses   Final diagnoses:  Chest pain, unspecified type  Shortness of breath   Discharge Instructions   None    ED Prescriptions    None     PDMP not reviewed this encounter.   Mickie Bail, NP 08/21/20 1538

## 2020-08-21 NOTE — ED Triage Notes (Signed)
Pt to ED via EMS from urgent care c/o chest pain. Gradual onset that started this morning around 10am,reports constant in nature , and gradullay getting worse as day progress. Reports SHOB . No n/v. Pt given 324 mg aspirin at urgent care. 1 nitro given by EMS with relief. # 20 RAC, #18LAC. Pt is a Pharmacist, community and uses steroids, current cycle using MWF, for past two months. HX: Asthma. Last VS : 135/73, TEMP 98.6, 110 PULSE, RR 18.

## 2020-08-21 NOTE — ED Triage Notes (Addendum)
Pt c/o acute central and left sternal CP onset while at rest approx 2 hours PTA. Also c/o SOB and "hot feeling".   Denies n/v, diaphoresis, radiating pain to back/jaw/arm, change in speech/vision.  Pt reports that he has been taking anabolic steroid injections 3 times/week since early September.  EKG performed and given to K. Arlana Pouch who arrived for immediate BS eval.

## 2020-08-21 NOTE — ED Notes (Signed)
Pt placed on Zoll for monitoring and PIV x2 initiated.

## 2020-08-21 NOTE — ED Notes (Signed)
EMS arrived for transport.

## 2022-05-25 IMAGING — CT CT ANGIO CHEST
2 of 6 series · 18 of 36 positions shown · IV contrast (omnipaque)
Comparison: None.

CLINICAL DATA: Midsternal chest pain with shortness of breath

EXAM:
CT ANGIOGRAPHY CHEST WITH CONTRAST
TECHNIQUE: Multidetector CT imaging of the chest was performed using the
standard protocol during bolus administration of intravenous
contrast. Multiplanar CT image reconstructions and MIPs were
obtained to evaluate the vascular anatomy.
CONTRAST:  75mL OMNIPAQUE IOHEXOL 350 MG/ML SOLN

[Series 7: pe thins · axial · 0.83mm/px · z∈[+1240,+1480]mm · 17 of 382 slices shown]
[im 20/382  lung]
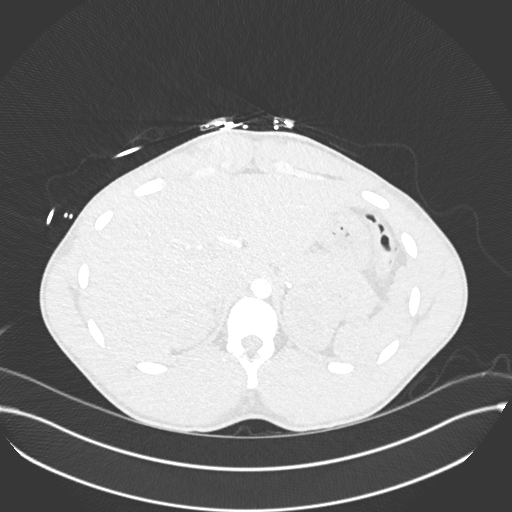
[im 39/382  mediastinal]
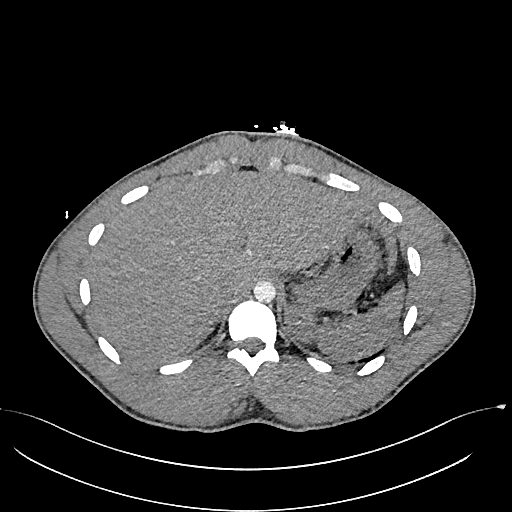
[im 58/382  lung]
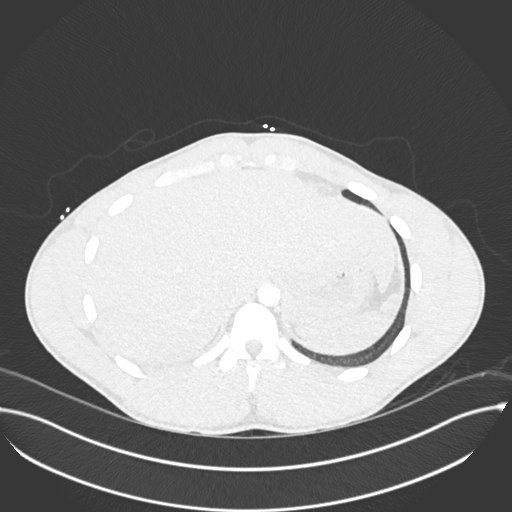
[im 77/382  mediastinal]
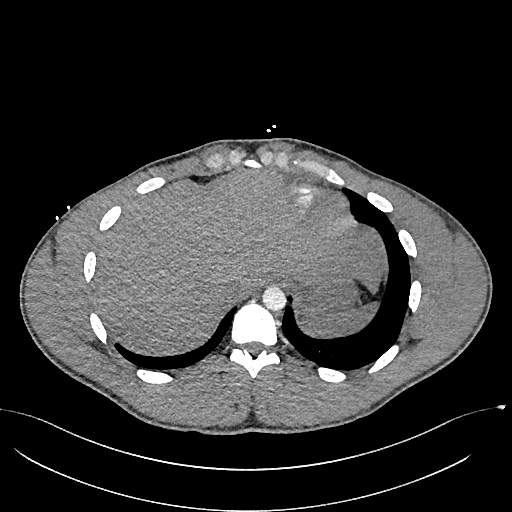
[im 115/382  lung]
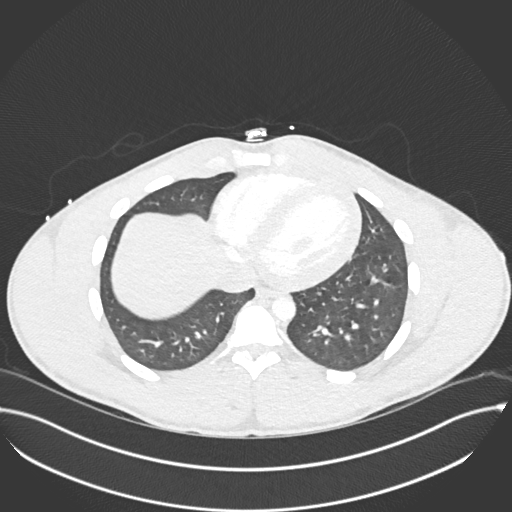
[im 134/382  mediastinal]
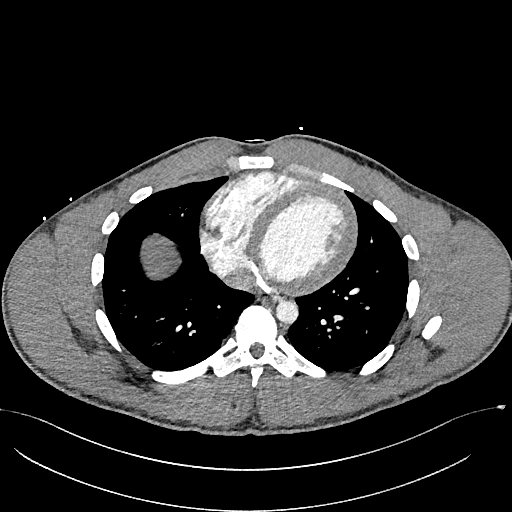
[im 153/382  lung]
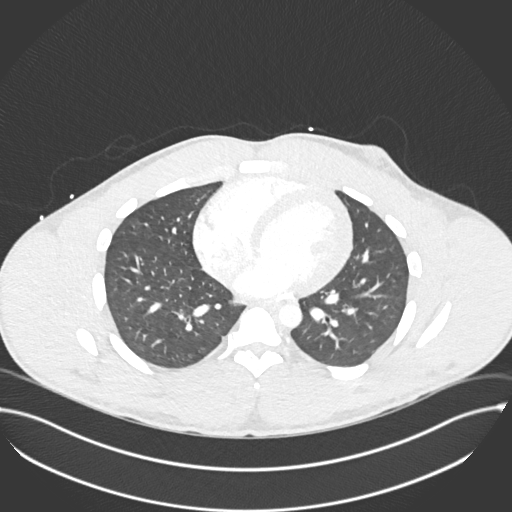
[im 172/382  mediastinal]
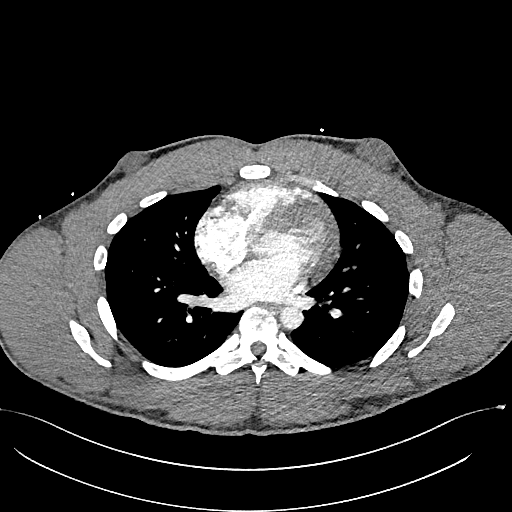
[im 191/382  lung]
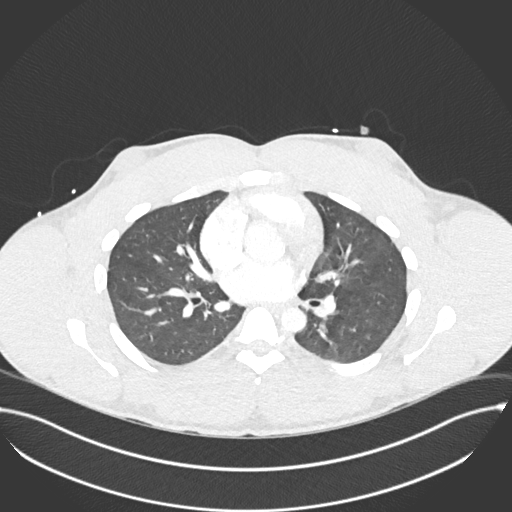
[im 210/382  mediastinal]
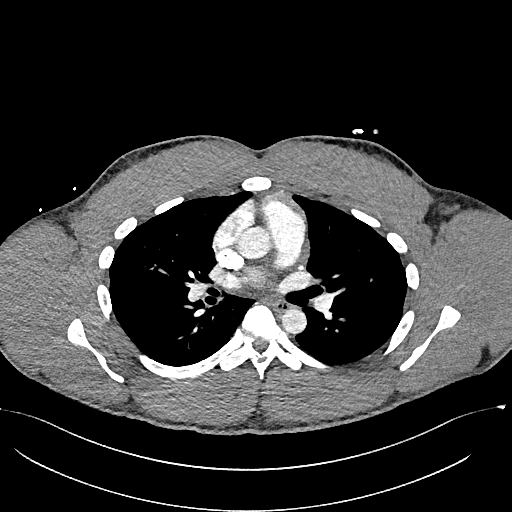
[im 229/382  lung]
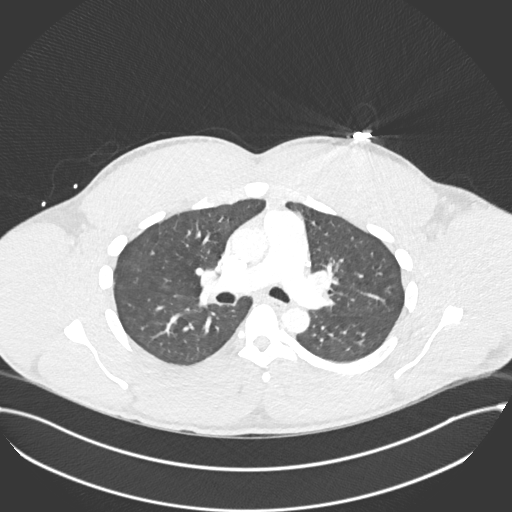
[im 248/382  mediastinal]
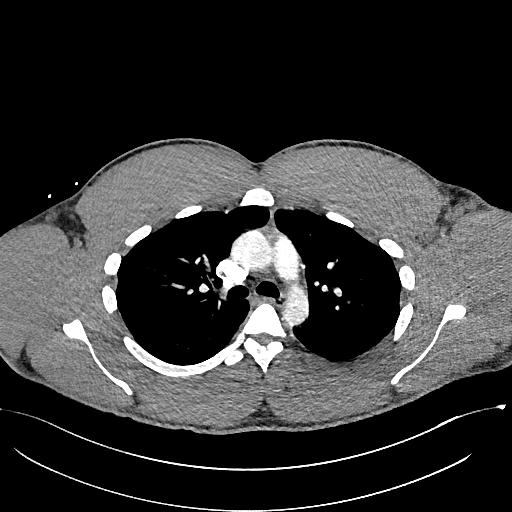
[im 267/382  lung]
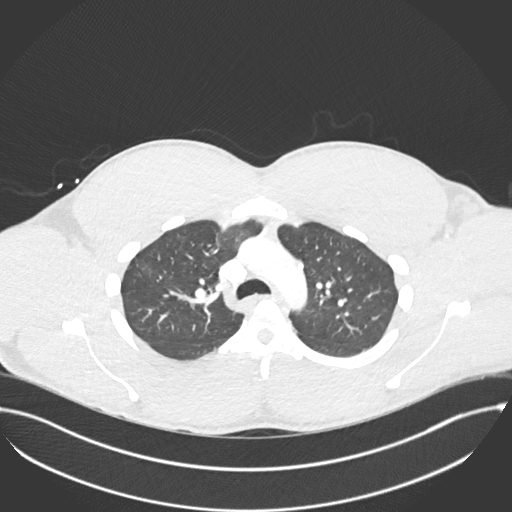
[im 305/382  mediastinal]
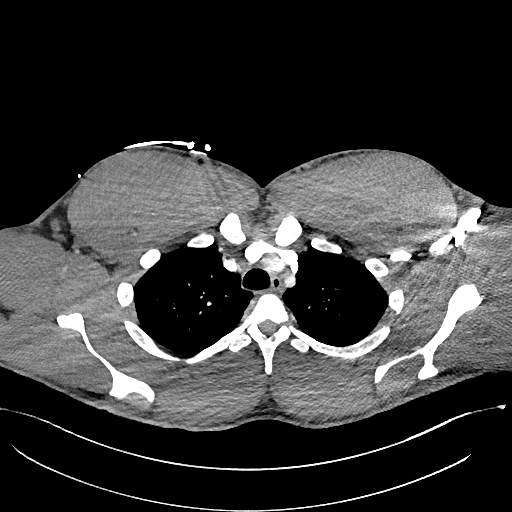
[im 324/382  lung]
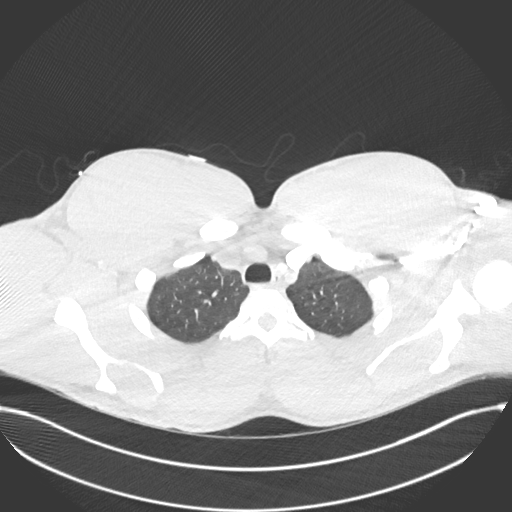
[im 343/382  mediastinal]
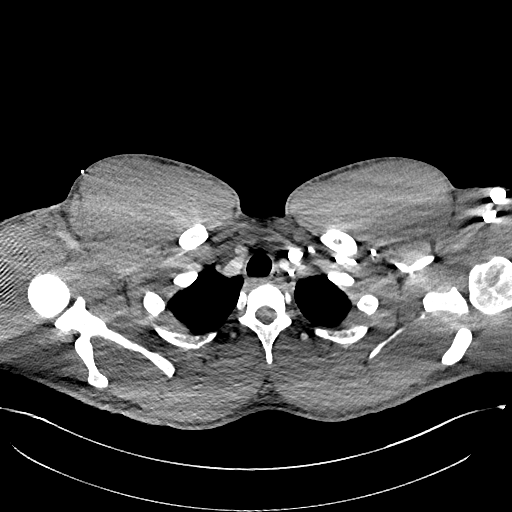
[im 362/382  lung]
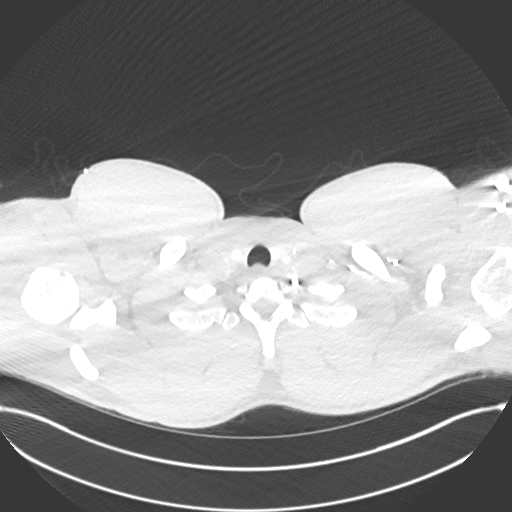

[Series 8: pe 2mm cor · coronal · 0.53mm/px · 1 of 126 slices shown]
[im 63/126  mediastinal]
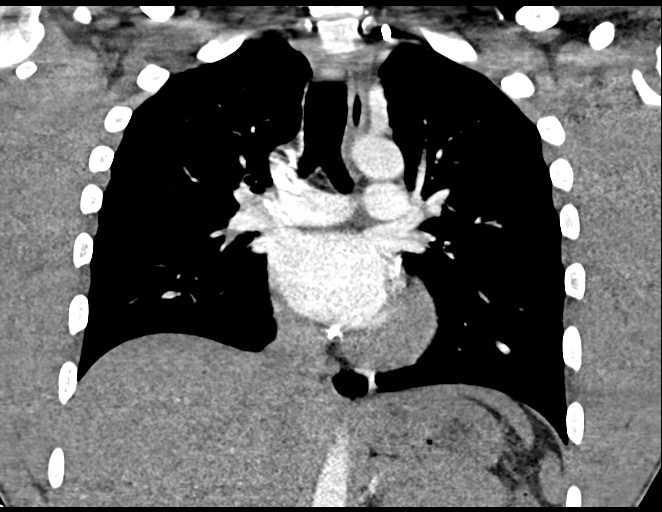

[18 of 36 positions shown; findings below may reference images not displayed]

FINDINGS: Cardiovascular: There is slightly suboptimal opacification of the
main pulmonary artery, however no central pulmonary embolism seen.
There is limited visualization of the distal segmental and
subsegmental branches. The heart is normal in size. No pericardial
effusion or thickening. No evidence right heart strain. There is
normal three-vessel brachiocephalic anatomy without proximal
stenosis. The thoracic aorta is normal in appearance.

Mediastinum/Nodes: No hilar, mediastinal, or axillary adenopathy.
Thyroid gland, trachea, and esophagus demonstrate no significant
findings.

Lungs/Pleura: The lungs are clear. No pleural effusion or
pneumothorax. No airspace consolidation.

Upper Abdomen: No acute abnormalities present in the visualized
portions of the upper abdomen.

Musculoskeletal: No chest wall abnormality. No acute or significant
osseous findings.

Review of the MIP images confirms the above findings.
IMPRESSION: Suboptimal opacification of the main pulmonary artery however no
central or segmental pulmonary embolism. There is poor opacification
of the distal segmental and subsegmental branches.

No other acute intrathoracic pathology to explain the patient's
symptoms.
